# Patient Record
Sex: Female | Born: 1975 | Race: Black or African American | Hispanic: No | Marital: Married | Smoking: Never smoker
Health system: Southern US, Community
[De-identification: ages and names within clinical notes are randomized; demographics above are authoritative.]

## PROBLEM LIST (undated history)

## (undated) ENCOUNTER — Inpatient Hospital Stay (HOSPITAL_COMMUNITY): Payer: Self-pay

## (undated) DIAGNOSIS — T783XXA Angioneurotic edema, initial encounter: Secondary | ICD-10-CM

## (undated) DIAGNOSIS — D649 Anemia, unspecified: Secondary | ICD-10-CM

## (undated) HISTORY — DX: Anemia, unspecified: D64.9

## (undated) HISTORY — PX: INNER EAR SURGERY: SHX679

## (undated) HISTORY — DX: Angioneurotic edema, initial encounter: T78.3XXA

---

## 2011-08-26 DIAGNOSIS — D649 Anemia, unspecified: Secondary | ICD-10-CM

## 2011-08-26 HISTORY — DX: Anemia, unspecified: D64.9

## 2011-09-05 ENCOUNTER — Encounter: Payer: Self-pay | Admitting: *Deleted

## 2011-09-05 ENCOUNTER — Ambulatory Visit (INDEPENDENT_AMBULATORY_CARE_PROVIDER_SITE_OTHER): Payer: Commercial Managed Care - PPO | Admitting: Family Medicine

## 2011-09-05 ENCOUNTER — Telehealth: Payer: Self-pay

## 2011-09-05 ENCOUNTER — Ambulatory Visit: Payer: Self-pay | Admitting: Women's Health

## 2011-09-05 VITALS — BP 120/79 | HR 79 | Temp 98.5°F | Resp 18 | Ht 64.5 in | Wt 156.0 lb

## 2011-09-05 DIAGNOSIS — K5289 Other specified noninfective gastroenteritis and colitis: Secondary | ICD-10-CM

## 2011-09-05 DIAGNOSIS — K529 Noninfective gastroenteritis and colitis, unspecified: Secondary | ICD-10-CM

## 2011-09-05 DIAGNOSIS — D509 Iron deficiency anemia, unspecified: Secondary | ICD-10-CM

## 2011-09-05 DIAGNOSIS — R1110 Vomiting, unspecified: Secondary | ICD-10-CM

## 2011-09-05 DIAGNOSIS — K0889 Other specified disorders of teeth and supporting structures: Secondary | ICD-10-CM

## 2011-09-05 DIAGNOSIS — R5383 Other fatigue: Secondary | ICD-10-CM

## 2011-09-05 DIAGNOSIS — R110 Nausea: Secondary | ICD-10-CM

## 2011-09-05 DIAGNOSIS — R5381 Other malaise: Secondary | ICD-10-CM

## 2011-09-05 LAB — POCT CBC
Lymph, poc: 1.2 (ref 0.6–3.4)
MCH, POC: 23.4 pg — AB (ref 27–31.2)
MCV: 75 fL — AB (ref 80–97)
MID (cbc): 0.3 (ref 0–0.9)
POC LYMPH PERCENT: 13.7 %L (ref 10–50)
Platelet Count, POC: 265 10*3/uL (ref 142–424)
RBC: 4.62 M/uL (ref 4.04–5.48)
WBC: 8.9 10*3/uL (ref 4.6–10.2)

## 2011-09-05 LAB — POCT URINALYSIS DIPSTICK
Blood, UA: NEGATIVE
Protein, UA: 30
Spec Grav, UA: >=1.03
Urobilinogen, UA: 1

## 2011-09-05 LAB — POCT UA - MICROSCOPIC ONLY
Casts, Ur, LPF, POC: NEGATIVE
Crystals, Ur, HPF, POC: NEGATIVE

## 2011-09-05 MED ORDER — PROMETHAZINE HCL 12.5 MG PO TABS: 12.5000 mg | ORAL_TABLET | Freq: Three times a day (TID) | ORAL | Status: DC | PRN

## 2011-09-05 MED ORDER — ONDANSETRON 4 MG PO TBDP
4.0000 mg | ORAL_TABLET | Freq: Once | ORAL | Status: AC
  Administered 2011-09-05: 4 mg via ORAL

## 2011-09-05 MED ORDER — ONDANSETRON 4 MG PO TBDP: 4.0000 mg | ORAL_TABLET | Freq: Four times a day (QID) | ORAL | Status: AC | PRN

## 2011-09-05 NOTE — Patient Instructions (Signed)
Viral Gastroenteritis Viral gastroenteritis is also known as stomach flu. This condition affects the stomach and intestinal tract. It can cause sudden diarrhea and vomiting. The illness typically lasts 3 to 8 days. Most people develop an immune response that eventually gets rid of the virus. While this natural response develops, the virus can make you quite ill. CAUSES  Many different viruses can cause gastroenteritis, such as rotavirus or noroviruses. You can catch one of these viruses by consuming contaminated food or water. You may also catch a virus by sharing utensils or other personal items with an infected person or by touching a contaminated surface. SYMPTOMS  The most common symptoms are diarrhea and vomiting. These problems can cause a severe loss of body fluids (dehydration) and a body salt (electrolyte) imbalance. Other symptoms may include:  Fever.   Headache.   Fatigue.   Abdominal pain.  DIAGNOSIS  Your caregiver can usually diagnose viral gastroenteritis based on your symptoms and a physical exam. A stool sample may also be taken to test for the presence of viruses or other infections. TREATMENT  This illness typically goes away on its own. Treatments are aimed at rehydration. The most serious cases of viral gastroenteritis involve vomiting so severely that you are not able to keep fluids down. In these cases, fluids must be given through an intravenous line (IV). HOME CARE INSTRUCTIONS   Drink enough fluids to keep your urine clear or pale yellow. Drink small amounts of fluids frequently and increase the amounts as tolerated.   Ask your caregiver for specific rehydration instructions.   Avoid:   Foods high in sugar.   Alcohol.   Carbonated drinks.   Tobacco.   Juice.   Caffeine drinks.   Extremely hot or cold fluids.   Fatty, greasy foods.   Too much intake of anything at one time.   Dairy products until 24 to 48 hours after diarrhea stops.   You may  consume probiotics. Probiotics are active cultures of beneficial bacteria. They may lessen the amount and number of diarrheal stools in adults. Probiotics can be found in yogurt with active cultures and in supplements.   Wash your hands well to avoid spreading the virus.   Only take over-the-counter or prescription medicines for pain, discomfort, or fever as directed by your caregiver. Do not give aspirin to children. Antidiarrheal medicines are not recommended.   Ask your caregiver if you should continue to take your regular prescribed and over-the-counter medicines.   Keep all follow-up appointments as directed by your caregiver.  SEEK IMMEDIATE MEDICAL CARE IF:   You are unable to keep fluids down.   You do not urinate at least once every 6 to 8 hours.   You develop shortness of breath.   You notice blood in your stool or vomit. This may look like coffee grounds.   You have abdominal pain that increases or is concentrated in one small area (localized).   You have persistent vomiting or diarrhea.   You have a fever.   The patient is a child younger than 3 months, and he or she has a fever.   The patient is a child older than 3 months, and he or she has a fever and persistent symptoms.   The patient is a child older than 3 months, and he or she has a fever and symptoms suddenly get worse.   The patient is a baby, and he or she has no tears when crying.  MAKE SURE YOU:     Understand these instructions.   Will watch your condition.   Will get help right away if you are not doing well or get worse.  Document Released: 06/13/2005 Document Revised: 06/02/2011 Document Reviewed: 03/30/2011 [employer] Patient Information 2012 [employer]. 

## 2011-09-05 NOTE — Telephone Encounter (Signed)
Pt calling back regarding meds being to expensive would like an alternative  [phone]

## 2011-09-05 NOTE — Telephone Encounter (Signed)
Pt notified that rx is at pharmacy.

## 2011-09-05 NOTE — Telephone Encounter (Signed)
.  [facility] PT STATES THE MEDICINE SHE WAS GIVEN TODAY BY DR HOPPER IS REALLY EXPENSIVE AND WOULD LIKE TO KNOW SOMETHING ELSE SHE CAN GET PLEASE CALL [phone]

## 2011-09-05 NOTE — Progress Notes (Signed)
Subjective: Patient is here for the first time. She had some right upper dental pain 2 days ago. She took some ibuprofen for that yesterday on an empty stomach, and went over to [place]. They were sitting eating pizza she got sick in his stomach started vomiting. She vomited yesterday evening and today. Now it is just bilious fluid that she vomits. She tried some ginger ale this morning but came back up. She has continued to urinate. She has some nonspecific mid abdominal pain. She has felt chilled. Has not been around anyone else with a gastroenteritis type problem that she knows of. The tooth still is a little bit tender, but not as bad.  Objective: Her TMs are normal. Throat clear. Teeth have some debris around the gums but no real erythema or dental abscess is noted. The Upper teeth are a little bit tender. Neck was supple without significant nodes. Chest clear. Heart regular without murmurs. Abdomen had normal bowel sounds, was soft and only mild nonspecific mid epigastric and periumbilical tenderness.   Her last period was 2 weeks ago. She is scheduled to see her GYN today to get on birth control as she is planning to get married in 2 months. She is not sexually involved.  Assessment: Vomiting Dental pain Gastroenteritis  Plan: With her chills I'm going in checking a CBC on her. Give her a Zofran 4 mg, then decide what treatment we do.  Results for orders placed in visit on 09/05/11  POCT CBC      Component Value Range   WBC 8.9  4.6 - 10.2 (K/uL)   Lymph, poc 1.2  0.6 - 3.4    POC LYMPH PERCENT 13.7  10 - 50 (%L)   MID (cbc) 0.3  0 - 0.9    POC MID % 3.1  0 - 12 (%M)   POC Granulocyte 7.4 (*) 2 - 6.9    Granulocyte percent 83.2 (*) 37 - 80 (%G)   RBC 4.62  4.04 - 5.48 (M/uL)   Hemoglobin 10.8 (*) 12.2 - 16.2 (g/dL)   HCT, POC 56.2 (*) 13.0 - 47.9 (%)   MCV 75.0 (*) 80 - 97 (fL)   MCH, POC 23.4 (*) 27 - 31.2 (pg)   MCHC 31.2 (*) 31.8 - 35.4 (g/dL)   RDW, POC 86.5     Platelet  Count, POC 265  142 - 424 (K/uL)   MPV 11.0  0 - 99.8 (fL)   Results for orders placed in visit on 09/05/11  POCT CBC      Component Value Range   WBC 8.9  4.6 - 10.2 (K/uL)   Lymph, poc 1.2  0.6 - 3.4    POC LYMPH PERCENT 13.7  10 - 50 (%L)   MID (cbc) 0.3  0 - 0.9    POC MID % 3.1  0 - 12 (%M)   POC Granulocyte 7.4 (*) 2 - 6.9    Granulocyte percent 83.2 (*) 37 - 80 (%G)   RBC 4.62  4.04 - 5.48 (M/uL)   Hemoglobin 10.8 (*) 12.2 - 16.2 (g/dL)   HCT, POC 78.4 (*) 69.6 - 47.9 (%)   MCV 75.0 (*) 80 - 97 (fL)   MCH, POC 23.4 (*) 27 - 31.2 (pg)   MCHC 31.2 (*) 31.8 - 35.4 (g/dL)   RDW, POC 29.5     Platelet Count, POC 265  142 - 424 (K/uL)   MPV 11.0  0 - 99.8 (fL)  POCT UA - MICROSCOPIC ONLY  Component Value Range   WBC, Ur, HPF, POC 4-6     RBC, urine, microscopic NEG     Bacteria, U Microscopic 1+     Mucus, UA POS     Epithelial cells, urine per micros 3-6     Crystals, Ur, HPF, POC NEG     Casts, Ur, LPF, POC NEG     Yeast, UA NEG    POCT URINALYSIS DIPSTICK      Component Value Range   Color, UA YELLOW     Clarity, UA CLEAR     Glucose, UA NEG     Bilirubin, UA SMALL     Ketones, UA TRACE     Spec Grav, UA >=1.030     Blood, UA NEG     pH, UA 5.5     Protein, UA 30     Urobilinogen, UA 1.0     Nitrite, UA NEG     Leukocytes, UA Negative      Urinalysis is concentrated, otherwise labs look good. She probably has some moderate dehydration.  Given excuse for 2 days. Encourage fluids. Take over-the-counter iron twice daily for 2 months, then once daily or if problems. Additional diagnoses of fatigue and deficiency anemia.

## 2011-09-05 NOTE — Telephone Encounter (Signed)
Done. Changed to Phenergan. Please tell patient Rx is at the pharmacy.  Helton Oleson

## 2011-09-06 LAB — COMPREHENSIVE METABOLIC PANEL
Alkaline Phosphatase: 69 U/L (ref 39–117)
BUN: 13 mg/dL (ref 6–23)
Creat: 0.7 mg/dL (ref 0.50–1.10)
Glucose, Bld: 124 mg/dL — ABNORMAL HIGH (ref 70–99)
Sodium: 139 mEq/L (ref 135–145)
Total Bilirubin: 0.5 mg/dL (ref 0.3–1.2)

## 2011-09-06 LAB — IRON: Iron: 25 ug/dL — ABNORMAL LOW (ref 42–145)

## 2011-09-06 LAB — IBC PANEL
%SAT: 5 % — ABNORMAL LOW (ref 20–55)
TIBC: 494 ug/dL — ABNORMAL HIGH (ref 250–470)

## 2011-09-12 ENCOUNTER — Encounter: Payer: Self-pay | Admitting: Family Medicine

## 2011-09-14 ENCOUNTER — Ambulatory Visit: Payer: Self-pay | Admitting: Women's Health

## 2011-09-26 ENCOUNTER — Encounter: Payer: Self-pay | Admitting: Women's Health

## 2011-09-26 ENCOUNTER — Ambulatory Visit (INDEPENDENT_AMBULATORY_CARE_PROVIDER_SITE_OTHER): Payer: Commercial Managed Care - PPO | Admitting: Women's Health

## 2011-09-26 ENCOUNTER — Other Ambulatory Visit (HOSPITAL_COMMUNITY)
Admission: RE | Admit: 2011-09-26 | Discharge: 2011-09-26 | Disposition: A | Discharge status: Home / Self Care | Payer: Commercial Managed Care - PPO | Source: Ambulatory Visit | Attending: Obstetrics and Gynecology | Admitting: Obstetrics and Gynecology

## 2011-09-26 VITALS — BP 110/72 | Ht 64.25 in | Wt 159.0 lb

## 2011-09-26 DIAGNOSIS — R7309 Other abnormal glucose: Secondary | ICD-10-CM

## 2011-09-26 DIAGNOSIS — IMO0001 Reserved for inherently not codable concepts without codable children: Secondary | ICD-10-CM

## 2011-09-26 DIAGNOSIS — Z01419 Encounter for gynecological examination (general) (routine) without abnormal findings: Secondary | ICD-10-CM | POA: Insufficient documentation

## 2011-09-26 DIAGNOSIS — R739 Hyperglycemia, unspecified: Secondary | ICD-10-CM

## 2011-09-26 DIAGNOSIS — Z309 Encounter for contraceptive management, unspecified: Secondary | ICD-10-CM

## 2011-09-26 MED ORDER — NORGESTIMATE-ETH ESTRADIOL 0.25-35 MG-MCG PO TABS: 1.0000 | ORAL_TABLET | Freq: Every day | ORAL | Status: DC

## 2011-09-26 NOTE — Patient Instructions (Signed)
Health Maintenance, Females A healthy lifestyle and preventative care can promote health and wellness.  Maintain regular health, dental, and eye exams.   Eat a healthy diet. Foods like vegetables, fruits, whole grains, low-fat dairy products, and lean protein foods contain the nutrients you need without too many calories. Decrease your intake of foods high in solid fats, added sugars, and salt. Get information about a proper diet from your caregiver, if necessary.   Regular physical exercise is one of the most important things you can do for your health. Most adults should get at least 150 minutes of moderate-intensity exercise (any activity that increases your heart rate and causes you to sweat) each week. In addition, most adults need muscle-strengthening exercises on 2 or more days a week.    Maintain a healthy weight. The body mass index (BMI) is a screening tool to identify possible weight problems. It provides an estimate of body fat based on height and weight. Your caregiver can help determine your BMI, and can help you achieve or maintain a healthy weight. For adults 20 years and older:   A BMI below 18.5 is considered underweight.   A BMI of 18.5 to 24.9 is normal.   A BMI of 25 to 29.9 is considered overweight.   A BMI of 30 and above is considered obese.   Maintain normal blood lipids and cholesterol by exercising and minimizing your intake of saturated fat. Eat a balanced diet with plenty of fruits and vegetables. Blood tests for lipids and cholesterol should begin at age 20 and be repeated every 5 years. If your lipid or cholesterol levels are high, you are over 50, or you are a high risk for heart disease, you may need your cholesterol levels checked more frequently.Ongoing high lipid and cholesterol levels should be treated with medicines if diet and exercise are not effective.   If you smoke, find out from your caregiver how to quit. If you do not use tobacco, do not start.    If you are pregnant, do not drink alcohol. If you are breastfeeding, be very cautious about drinking alcohol. If you are not pregnant and choose to drink alcohol, do not exceed 1 drink per day. One drink is considered to be 12 ounces (355 mL) of beer, 5 ounces (148 mL) of wine, or 1.5 ounces (44 mL) of liquor.   Avoid use of street drugs. Do not share needles with anyone. Ask for help if you need support or instructions about stopping the use of drugs.   High blood pressure causes heart disease and increases the risk of stroke. Blood pressure should be checked at least every 1 to 2 years. Ongoing high blood pressure should be treated with medicines, if weight loss and exercise are not effective.   If you are 55 to 34 years old, ask your caregiver if you should take aspirin to prevent strokes.   Diabetes screening involves taking a blood sample to check your fasting blood sugar level. This should be done once every 3 years, after age 45, if you are within normal weight and without risk factors for diabetes. Testing should be considered at a younger age or be carried out more frequently if you are overweight and have at least 1 risk factor for diabetes.   Breast cancer screening is essential preventative care for women. You should practice "breast self-awareness." This means understanding the normal appearance and feel of your breasts and may include breast self-examination. Any changes detected, no matter how   small, should be reported to a caregiver. Women in their 20s and 30s should have a clinical breast exam (CBE) by a caregiver as part of a regular health exam every 1 to 3 years. After age 40, women should have a CBE every year. Starting at age 40, women should consider having a mammogram (breast X-ray) every year. Women who have a family history of breast cancer should talk to their caregiver about genetic screening. Women at a high risk of breast cancer should talk to their caregiver about having  an MRI and a mammogram every year.   The Pap test is a screening test for cervical cancer. Women should have a Pap test starting at age 21. Between ages 21 and 29, Pap tests should be repeated every 2 years. Beginning at age 30, you should have a Pap test every 3 years as long as the past 3 Pap tests have been normal. If you had a hysterectomy for a problem that was not cancer or a condition that could lead to cancer, then you no longer need Pap tests. If you are between ages 65 and 70, and you have had normal Pap tests going back 10 years, you no longer need Pap tests. If you have had past treatment for cervical cancer or a condition that could lead to cancer, you need Pap tests and screening for cancer for at least 20 years after your treatment. If Pap tests have been discontinued, risk factors (such as a new sexual partner) need to be reassessed to determine if screening should be resumed. Some women have medical problems that increase the chance of getting cervical cancer. In these cases, your caregiver may recommend more frequent screening and Pap tests.   The human papillomavirus (HPV) test is an additional test that may be used for cervical cancer screening. The HPV test looks for the virus that can cause the cell changes on the cervix. The cells collected during the Pap test can be tested for HPV. The HPV test could be used to screen women aged 30 years and older, and should be used in women of any age who have unclear Pap test results. After the age of 30, women should have HPV testing at the same frequency as a Pap test.   Colorectal cancer can be detected and often prevented. Most routine colorectal cancer screening begins at the age of 50 and continues through age 75. However, your caregiver may recommend screening at an earlier age if you have risk factors for colon cancer. On a yearly basis, your caregiver may provide home test kits to check for hidden blood in the stool. Use of a small camera at  the end of a tube, to directly examine the colon (sigmoidoscopy or colonoscopy), can detect the earliest forms of colorectal cancer. Talk to your caregiver about this at age 50, when routine screening begins. Direct examination of the colon should be repeated every 5 to 10 years through age 75, unless early forms of pre-cancerous polyps or small growths are found.   Hepatitis C blood testing is recommended for all people born from 1945 through 1965 and any individual with known risks for hepatitis C.   Practice safe sex. Use condoms and avoid high-risk sexual practices to reduce the spread of sexually transmitted infections (STIs). Sexually active women aged 25 and younger should be checked for Chlamydia, which is a common sexually transmitted infection. Older women with new or multiple partners should also be tested for Chlamydia. Testing for other   STIs is recommended if you are sexually active and at increased risk.   Osteoporosis is a disease in which the bones lose minerals and strength with aging. This can result in serious bone fractures. The risk of osteoporosis can be identified using a bone density scan. Women ages 63 and over and women at risk for fractures or osteoporosis should discuss screening with their caregivers. Ask your caregiver whether you should be taking a calcium supplement or vitamin D to reduce the rate of osteoporosis.   Menopause can be associated with physical symptoms and risks. Hormone replacement therapy is available to decrease symptoms and risks. You should talk to your caregiver about whether hormone replacement therapy is right for you.   Use sunscreen with a sun protection factor (SPF) of 30 or greater. Apply sunscreen liberally and repeatedly throughout the day. You should seek shade when your shadow is shorter than you. Protect yourself by wearing long sleeves, pants, a wide-brimmed hat, and sunglasses year round, whenever you are outdoors.   Notify your caregiver  of new moles or changes in moles, especially if there is a change in shape or color. Also notify your caregiver if a mole is larger than the size of a pencil eraser.   Stay current with your immunizations.  Document Released: 12/27/2010 Document Revised: 06/02/2011 Document Reviewed: 12/27/2010 [facility] Patient Information 2012 [place], [place].Diabetes and Exercise Regular exercise is important and can help:   Control blood glucose (sugar).   Decrease blood pressure.    Control blood lipids (cholesterol, triglycerides).   Improve overall health.  BENEFITS FROM EXERCISE  Improved fitness.   Improved flexibility.   Improved endurance.   Increased bone density.   Weight control.   Increased muscle strength.   Decreased body fat.   Improvement of the body's use of insulin, a hormone.   Increased insulin sensitivity.   Reduction of insulin needs.   Reduced stress and tension.   Helps you feel better.  People with diabetes who add exercise to their lifestyle gain additional benefits, including:  Weight loss.   Reduced appetite.   Improvement of the body's use of blood glucose.   Decreased risk factors for heart disease:   Lowering of cholesterol and triglycerides.   Raising the level of good cholesterol (high-density lipoproteins, HDL).   Lowering blood sugar.   Decreased blood pressure.  TYPE 1 DIABETES AND EXERCISE  Exercise will usually lower your blood glucose.   If blood glucose is greater than 240 mg/dl, check urine ketones. If ketones are present, do not exercise.   Location of the insulin injection sites may need to be adjusted with exercise. Avoid injecting insulin into areas of the body that will be exercised. For example, avoid injecting insulin into:   The arms when playing tennis.   The legs when jogging. For more information, discuss this with your caregiver.   Keep a record of:   Food intake.   Type and amount of exercise.    Expected peak times of insulin action.   Blood glucose levels.  Do this before, during, and after exercise. Review your records with your caregiver. This will help you to develop guidelines for adjusting food intake and insulin amounts.  TYPE 2 DIABETES AND EXERCISE  Regular physical activity can help control blood glucose.   Exercise is important because it may:   Increase the body's sensitivity to insulin.   Improve blood glucose control.   Exercise reduces the risk of heart disease. It decreases serum cholesterol  and triglycerides. It also lowers blood pressure.   Those who take insulin or oral hypoglycemic agents should watch for signs of hypoglycemia. These signs include dizziness, shaking, sweating, chills, and confusion.   Body water is lost during exercise. It must be replaced. This will help to avoid loss of body fluids (dehydration) or heat stroke.  Be sure to talk to your caregiver before starting an exercise program to make sure it is safe for you. Remember, any activity is better than none.  Document Released: 09/03/2003 Document Revised: 06/02/2011 Document Reviewed: 12/18/2008 [facility] Patient Information 2012 [place], [place].

## 2011-09-26 NOTE — Progress Notes (Signed)
Karen Pollard 06-02-1976 [id number]    History:    The patient presents for annual exam.  Presents for her first GYN exam/virgin and requesting  birth control pills. States saw a primary care doctor at an urgent care last week and had an elevated blood sugar of 124 and was found to be slightly anemic. Has followup in one week.   Past medical history, past surgical history, family history and social history were all reviewed and documented in the EPIC chart. 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   ROS:  A  ROS was performed and pertinent positives and negatives are included in the history.  Exam:  Filed Vitals:   09/26/11 1427  BP: 110/72    General appearance:  Normal Head/Neck:  Normal, without cervical or supraclavicular adenopathy. Thyroid:  Symmetrical, normal in size, without palpable masses or nodularity. Respiratory  Effort:  Normal  Auscultation:  Clear without wheezing or rhonchi Cardiovascular  Auscultation:  Regular rate, without rubs, murmurs or gallops  Edema/varicosities:  Not grossly evident Abdominal  Soft,nontender, without masses, guarding or rebound.  Liver/spleen:  No organomegaly noted  Hernia:  None appreciated  Skin  Inspection:  Grossly normal  Palpation:  Grossly normal Neurologic/psychiatric  Orientation:  Normal with appropriate conversation.  Mood/affect:  Normal  Genitourinary    Breasts: Examined lying and sitting.     Right: Without masses, retractions, discharge or axillary adenopathy.     Left: Without masses, retractions, discharge or axillary adenopathy.   Inguinal/mons:  Normal without inguinal adenopathy  External genitalia:  Normal  BUS/Urethra/Skene's glands:  Normal  Bladder:  Normal  Vagina:  Normal/virginal  Cervix:  Normal  Uterus:   normal in size, shape and contour.  Midline and mobile  Adnexa/parametria:     Rt: Without masses or tenderness.   Lt: Without masses or tenderness.  Anus and perineum: Normal  Digital  rectal exam: Normal sphincter tone without palpated masses or tenderness  Assessment/Plan:  36 y.o. SBF virgin for annual exam.    Contraception management  Elevated blood sugar/anemia-primary care  Plan: Rubella titer, hemoglobin A1c and Pap. Reviewed will mail results to patient to take to followup appointment. Reviewed importance of followup for the elevated blood sugar/questionable diabetes. Discussed decreasing simple sugars/sodas and carbs, increasing exercise. Instructed to have fiance had a STD screen prior to marriage. Contraception options reviewed, Ortho-Cyclen prescription, proper use, slight risk for blood clots and strokes reviewed. Nonsmoker. Instructed to start first day of next cycle, reviewed if cycle due week of honeymoon to start new pack skipping placebo week. Instructions both verbally reviewed and written for patient. SBE's, exercise, calcium rich diet, MVI daily encouraged.     Harrington Challenger [facility], 5:42 PM 09/26/2011

## 2011-09-27 LAB — RUBELLA SCREEN: Rubella: 5.6 IU/mL — ABNORMAL HIGH

## 2011-11-03 ENCOUNTER — Telehealth: Payer: Self-pay | Admitting: *Deleted

## 2011-11-03 DIAGNOSIS — IMO0001 Reserved for inherently not codable concepts without codable children: Secondary | ICD-10-CM

## 2011-11-03 MED ORDER — NORGESTIMATE-ETH ESTRADIOL 0.25-35 MG-MCG PO TABS: 1.0000 | ORAL_TABLET | Freq: Every day | ORAL | Status: DC

## 2011-11-03 NOTE — Telephone Encounter (Signed)
Pt called asking rx be sent to [facility] rather than [facility], rx sent.

## 2011-12-13 ENCOUNTER — Telehealth: Payer: Self-pay | Admitting: *Deleted

## 2011-12-13 NOTE — Telephone Encounter (Signed)
Pt called concerned with black thin line from top of her stomach to belly button, pt has no taken UPT yet will do this in am. LMP: last week of may.  irregular cycle past before. Pt will call with UPT test results.

## 2011-12-14 ENCOUNTER — Telehealth: Payer: Self-pay | Admitting: *Deleted

## 2011-12-14 NOTE — Telephone Encounter (Signed)
Follow up from yesterday telephone encounter, pt took upt and it was negative, she will wait until period will start and if no period will recheck upt again and follow up with results.

## 2012-01-13 ENCOUNTER — Encounter: Payer: Self-pay | Admitting: Women's Health

## 2012-01-13 ENCOUNTER — Ambulatory Visit (INDEPENDENT_AMBULATORY_CARE_PROVIDER_SITE_OTHER): Payer: Commercial Managed Care - PPO | Admitting: Women's Health

## 2012-01-13 ENCOUNTER — Telehealth: Payer: Self-pay | Admitting: Women's Health

## 2012-01-13 VITALS — BP 110/70 | Ht 64.25 in | Wt 166.0 lb

## 2012-01-13 DIAGNOSIS — Z833 Family history of diabetes mellitus: Secondary | ICD-10-CM

## 2012-01-13 DIAGNOSIS — Z01419 Encounter for gynecological examination (general) (routine) without abnormal findings: Secondary | ICD-10-CM

## 2012-01-13 DIAGNOSIS — N912 Amenorrhea, unspecified: Secondary | ICD-10-CM

## 2012-01-13 DIAGNOSIS — E079 Disorder of thyroid, unspecified: Secondary | ICD-10-CM

## 2012-01-13 DIAGNOSIS — R5383 Other fatigue: Secondary | ICD-10-CM

## 2012-01-13 NOTE — Progress Notes (Signed)
Patient ID: Jameka Ivie, female   DOB: 01/20/1976, 36 y.o.   MRN: [id number] Presents with the complaint of increased fatigue, weight gain, and spotting. Was seen in the office in April for annual exam and  contraception management. Started Ortho-Cyclen as instructed for one month and then did not continue. Last cycle 3-1/2 weeks ago. Weight today 166, weight in April 159. States feels like she is getting enough sleep at night. Had an elevated blood sugar of 124 with a normal hemoglobin A1c was instructed to followup with primary care but did not.  Exam: Mentally slow, abdomen soft nontender, external genitalia within normal limits, speculum exam moderate menses type blood noted. Bimanual uterus is small nontender. U PT negative.  Fatigue Contraception management  Plan: CBC, TSH, glucose. Instructed to start back on Ortho-Cyclen take daily can start today. Encouraged condoms first month. Instructed to call if cycles don't regulate. Encouraged a multivitamin daily, sleep hygiene reviewed.

## 2012-01-16 ENCOUNTER — Other Ambulatory Visit: Payer: Commercial Managed Care - PPO

## 2012-01-16 LAB — CBC WITH DIFFERENTIAL/PLATELET
Basophils Relative: 1 % (ref 0–1)
Eosinophils Absolute: 0.3 10*3/uL (ref 0.0–0.7)
HCT: 35.8 % — ABNORMAL LOW (ref 36.0–46.0)
Hemoglobin: 11.9 g/dL — ABNORMAL LOW (ref 12.0–15.0)
Lymphs Abs: 1.3 10*3/uL (ref 0.7–4.0)
MCH: 27 pg (ref 26.0–34.0)
MCHC: 33.2 g/dL (ref 30.0–36.0)
MCV: 81.2 fL (ref 78.0–100.0)
Monocytes Absolute: 0.3 10*3/uL (ref 0.1–1.0)
Monocytes Relative: 8 % (ref 3–12)
Neutrophils Relative %: 46 % (ref 43–77)
RBC: 4.41 MIL/uL (ref 3.87–5.11)

## 2012-01-16 LAB — GLUCOSE, RANDOM: Glucose, Bld: 83 mg/dL (ref 70–99)

## 2012-01-18 NOTE — Telephone Encounter (Signed)
Instructed to return to the office for labs.

## 2012-03-09 ENCOUNTER — Emergency Department (INDEPENDENT_AMBULATORY_CARE_PROVIDER_SITE_OTHER)
Admission: EM | Admit: 2012-03-09 | Discharge: 2012-03-09 | Disposition: A | Discharge status: Home / Self Care | Payer: BC Managed Care – PPO | Source: Home / Self Care

## 2012-03-09 ENCOUNTER — Encounter (HOSPITAL_COMMUNITY): Payer: Self-pay | Admitting: Emergency Medicine

## 2012-03-09 DIAGNOSIS — S39012A Strain of muscle, fascia and tendon of lower back, initial encounter: Secondary | ICD-10-CM

## 2012-03-09 DIAGNOSIS — S338XXA Sprain of other parts of lumbar spine and pelvis, initial encounter: Secondary | ICD-10-CM

## 2012-03-09 MED ORDER — NAPROXEN 500 MG PO TABS: 500.0000 mg | ORAL_TABLET | Freq: Two times a day (BID) | ORAL | Status: DC

## 2012-03-09 MED ORDER — TRAMADOL HCL 50 MG PO TABS: 50.0000 mg | ORAL_TABLET | Freq: Four times a day (QID) | ORAL | Status: AC | PRN

## 2012-03-09 NOTE — ED Provider Notes (Signed)
History     CSN: [id number]  Arrival date & time 03/09/12  0865   None     Chief Complaint  Patient presents with  . Back Pain    (Consider location/radiation/quality/duration/timing/severity/associated sxs/prior treatment) Patient is a 36 y.o. female presenting with back pain.  Back Pain  This is a new problem. The current episode started 2 days ago. The problem occurs daily. The problem has not changed since onset.The pain is associated with twisting and lifting heavy objects. The quality of the pain is described as aching. The pain does not radiate. The pain is at a severity of 5/10. The pain is moderate. The symptoms are aggravated by bending, twisting and certain positions. Pertinent negatives include no chest pain, no fever, no numbness, no weight loss, no headaches, no abdominal pain, no abdominal swelling and no weakness. She has tried nothing for the symptoms. Risk factors include poor posture and lack of exercise.    Past Medical History  Diagnosis Date  . Diabetes mellitus 3-13    BORDERLINE  . Anemia 3-13    TOLD TO TAKE IRON PILLS    Past Surgical History  Procedure Date  . Inner ear surgery AS A CHILD    DOES NOT REMEMBER WHICH EAR    Family History  Problem Relation Age of Onset  . Hypertension Father   . Diabetes Father     History  Substance Use Topics  . Smoking status: Never Smoker   . Smokeless tobacco: Never Used  . Alcohol Use: No    OB History    Grav Para Term Preterm Abortions TAB SAB Ect Mult Living   0               Review of Systems  Constitutional: Negative for fever, weight loss and fatigue.  Respiratory: Negative.   Cardiovascular: Negative for chest pain.  Gastrointestinal: Negative.  Negative for abdominal pain.  Genitourinary: Negative.   Musculoskeletal: Positive for myalgias and back pain. Negative for joint swelling, arthralgias and gait problem.  Skin: Negative.   Neurological: Negative.  Negative for tremors,  weakness, numbness and headaches.    Allergies  Review of patient's allergies indicates no known allergies.  Home Medications   Current Outpatient Rx  Name Route Sig Dispense Refill  . NAPROXEN 500 MG PO TABS Oral Take 1 tablet (500 mg total) by mouth 2 (two) times daily. With food 30 tablet 0  . NORGESTIMATE-ETH ESTRADIOL 0.25-35 MG-MCG PO TABS Oral Take 1 tablet by mouth daily. 3 Package 3  . TRAMADOL HCL 50 MG PO TABS Oral Take 1 tablet (50 mg total) by mouth every 6 (six) hours as needed for pain. 15 tablet 0    BP 129/80  Pulse 76  Temp 98.9 F (37.2 C) (Oral)  Resp 16  SpO2 100%  LMP 02/19/2012  Physical Exam  Constitutional: She is oriented to person, place, and time. She appears well-developed and well-nourished. No distress.  Neck: Normal range of motion. Neck supple.  Pulmonary/Chest: Effort normal. She has no wheezes.  Musculoskeletal:       Tenderness over sacrum and L parasacral muscles; pain reproduced with forward flexion to 90 deg.   Neurological: She is alert and oriented to person, place, and time. She has normal reflexes. No cranial nerve deficit.    ED Course  Procedures (including critical care time)  Labs Reviewed - No data to display No results found.   1. Strain, sacral       MDM  ULtram 50mg  q 6h prn Naprosyn bid with food BID Heat, stretches and back exercises. Limit bending, lifting, pulling        Hayden Rasmussen, NP 03/09/12 1013  Hayden Rasmussen, NP 03/09/12 1015

## 2012-03-09 NOTE — ED Notes (Signed)
Pt c/o lower back pain x3 days... Says she bend over to get something from the fridge when she felt something "funny"... Since then, she's been having difficulty to walk and having pain that shoots to her left leg and left arm... She denies any numbness or weakness.

## 2012-03-10 NOTE — ED Provider Notes (Signed)
Medical screening examination/treatment/procedure(s) were performed by resident physician or non-physician practitioner and as supervising physician I was immediately available for consultation/collaboration.   Barkley Bruns MD.    Linna Hoff, MD 03/10/12 1048

## 2012-11-07 ENCOUNTER — Encounter: Payer: Self-pay | Admitting: Women's Health

## 2012-11-14 ENCOUNTER — Ambulatory Visit (INDEPENDENT_AMBULATORY_CARE_PROVIDER_SITE_OTHER): Payer: Commercial Managed Care - PPO | Admitting: Women's Health

## 2012-11-14 ENCOUNTER — Encounter: Payer: Self-pay | Admitting: Women's Health

## 2012-11-14 VITALS — BP 124/80 | Ht 64.0 in | Wt 182.0 lb

## 2012-11-14 DIAGNOSIS — Z833 Family history of diabetes mellitus: Secondary | ICD-10-CM

## 2012-11-14 DIAGNOSIS — Z01419 Encounter for gynecological examination (general) (routine) without abnormal findings: Secondary | ICD-10-CM

## 2012-11-14 DIAGNOSIS — Z789 Other specified health status: Secondary | ICD-10-CM | POA: Insufficient documentation

## 2012-11-14 DIAGNOSIS — N926 Irregular menstruation, unspecified: Secondary | ICD-10-CM

## 2012-11-14 DIAGNOSIS — N946 Dysmenorrhea, unspecified: Secondary | ICD-10-CM

## 2012-11-14 MED ORDER — IBUPROFEN 600 MG PO TABS: 600.0000 mg | ORAL_TABLET | Freq: Three times a day (TID) | ORAL | Status: DC | PRN

## 2012-11-14 NOTE — Progress Notes (Signed)
Karen Pollard January 04, 1976 [id number]    History:    The patient presents for annual exam.  Report cycles as irregular, every 3-5 weeks for 5 days. Stopped birth control pills 4-5 months ago desiring conception. Husband has 4 other children youngest 5. First Pap 2013, normal. Blood sugar 124 at family practice office, recheck 21 with a hemoglobin A1c of 5.3. Rubella equivocal did not get vaccine.   Past medical history, past surgical history, family history and social history were all reviewed and documented in the EPIC chart. 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   ROS:  A  ROS was performed and pertinent positives and negatives are included in the history.  Exam:  Filed Vitals:   11/14/12 1548  BP: 124/80    General appearance:  Normal Head/Neck:  Normal, without cervical or supraclavicular adenopathy. Thyroid:  Symmetrical, normal in size, without palpable masses or nodularity. Respiratory  Effort:  Normal  Auscultation:  Clear without wheezing or rhonchi Cardiovascular  Auscultation:  Regular rate, without rubs, murmurs or gallops  Edema/varicosities:  Not grossly evident Abdominal  Soft,nontender, without masses, guarding or rebound.  Liver/spleen:  No organomegaly noted  Hernia:  None appreciated  Skin  Inspection:  Grossly normal  Palpation:  Grossly normal Neurologic/psychiatric  Orientation:  Normal with appropriate conversation.  Mood/affect:  Normal  Genitourinary    Breasts: Examined lying and sitting.     Right: Without masses, retractions, discharge or axillary adenopathy.     Left: Without masses, retractions, discharge or axillary adenopathy.   Inguinal/mons:  Normal without inguinal adenopathy  External genitalia:  Normal  BUS/Urethra/Skene's glands:  Normal  Bladder:  Normal  Vagina:  Refused pelvic exam today was on her cycle.  Assessment/Plan:  37 y.o. MBF G0 for annual exam desiring conception.  Normal GYN exam Desiring  conception/off OC's 5 months Rubella equivocal 2013  Plan: Conceptual timing reviewed, reviewed importance of increasing frequency of intercourse day 7 through  46. Reviewed if does not conceive within the year to return to office for further testing. CBC, glucose, TSH, prolactin, UA, Pap normal 2013. SBE's, exercise, calcium rich diet, MVI daily, and decrease calories for weight loss and health.      Harrington Challenger [facility], 5:03 PM 11/14/2012

## 2012-11-14 NOTE — Patient Instructions (Addendum)

## 2012-11-15 ENCOUNTER — Other Ambulatory Visit: Payer: Self-pay | Admitting: Women's Health

## 2012-11-15 LAB — CBC WITH DIFFERENTIAL/PLATELET
Basophils Relative: 1 % (ref 0–1)
Eosinophils Absolute: 0.3 10*3/uL (ref 0.0–0.7)
HCT: 34.5 % — ABNORMAL LOW (ref 36.0–46.0)
Hemoglobin: 11 g/dL — ABNORMAL LOW (ref 12.0–15.0)
MCH: 25.9 pg — ABNORMAL LOW (ref 26.0–34.0)
MCHC: 31.9 g/dL (ref 30.0–36.0)
Monocytes Absolute: 0.5 10*3/uL (ref 0.1–1.0)
Monocytes Relative: 8 % (ref 3–12)
RDW: 16.9 % — ABNORMAL HIGH (ref 11.5–15.5)

## 2012-11-15 LAB — GLUCOSE, RANDOM: Glucose, Bld: 83 mg/dL (ref 70–99)

## 2012-11-15 LAB — URINALYSIS W MICROSCOPIC + REFLEX CULTURE
Bilirubin Urine: NEGATIVE
Casts: NONE SEEN
Crystals: NONE SEEN
Nitrite: NEGATIVE
RBC / HPF: >50 RBC/hpf — AB (ref <3)
Specific Gravity, Urine: 1.025 (ref 1.005–1.030)
Squamous Epithelial / LPF: NONE SEEN
Urobilinogen, UA: 0.2 mg/dL (ref 0.0–1.0)
pH: 7 (ref 5.0–8.0)

## 2012-11-16 LAB — URINE CULTURE: Colony Count: 70000

## 2012-11-23 ENCOUNTER — Other Ambulatory Visit: Payer: Commercial Managed Care - PPO

## 2013-01-16 ENCOUNTER — Emergency Department (INDEPENDENT_AMBULATORY_CARE_PROVIDER_SITE_OTHER)
Admission: EM | Admit: 2013-01-16 | Discharge: 2013-01-16 | Disposition: A | Discharge status: Home / Self Care | Payer: Commercial Managed Care - PPO | Source: Home / Self Care | Attending: Family Medicine | Admitting: Family Medicine

## 2013-01-16 ENCOUNTER — Encounter (HOSPITAL_COMMUNITY): Payer: Self-pay | Admitting: *Deleted

## 2013-01-16 DIAGNOSIS — J309 Allergic rhinitis, unspecified: Secondary | ICD-10-CM

## 2013-01-16 DIAGNOSIS — J300 Vasomotor rhinitis: Secondary | ICD-10-CM

## 2013-01-16 MED ORDER — FLUTICASONE PROPIONATE 50 MCG/ACT NA SUSP: 1.0000 | Freq: Two times a day (BID) | NASAL | Status: DC

## 2013-01-16 MED ORDER — METHYLPREDNISOLONE ACETATE 40 MG/ML IJ SUSP
80.0000 mg | Freq: Once | INTRAMUSCULAR | Status: AC
  Administered 2013-01-16: 80 mg via INTRAMUSCULAR

## 2013-01-16 MED ORDER — METHYLPREDNISOLONE ACETATE 80 MG/ML IJ SUSP
INTRAMUSCULAR | Status: AC
  Filled 2013-01-16: qty 1

## 2013-01-16 NOTE — ED Notes (Signed)
C/o throat felt congested on Sat.  C/o headache, pressure in her sinuses at night and can't breathe through her nose and runny nose in the daytime.  Coughs at night. C/o her eyes hurting due to pressure.

## 2013-01-16 NOTE — ED Provider Notes (Signed)
   History    CSN: [id number] Arrival date & time 01/16/13  1641  First MD Initiated Contact with Patient 01/16/13 1716     Chief Complaint  Patient presents with  . URI   (Consider location/radiation/quality/duration/timing/severity/associated sxs/prior Treatment) Patient is a 37 y.o. female presenting with URI. The history is provided by the patient.  URI Presenting symptoms: congestion, cough and rhinorrhea   Presenting symptoms: no fever and no sore throat   Severity:  Mild Duration:  3 days Chronicity:  New Relieved by:  None tried Worsened by:  Nothing tried Ineffective treatments:  None tried Associated symptoms: sinus pain    Past Medical History  Diagnosis Date  . Diabetes mellitus 3-13    BORDERLINE  . Anemia 3-13    TOLD TO TAKE IRON PILLS   Past Surgical History  Procedure Laterality Date  . Inner ear surgery  AS A CHILD    DOES NOT REMEMBER WHICH EAR   Family History  Problem Relation Age of Onset  . Hypertension Father   . Diabetes Father    History  Substance Use Topics  . Smoking status: Never Smoker   . Smokeless tobacco: Never Used  . Alcohol Use: No     Comment: Rare   OB History   Grav Para Term Preterm Abortions TAB SAB Ect Mult Living   0              Review of Systems  Constitutional: Negative.  Negative for fever.  HENT: Positive for congestion, rhinorrhea and postnasal drip. Negative for sore throat.   Respiratory: Positive for cough. Negative for shortness of breath.     Allergies  Review of patient's allergies indicates no known allergies.  Home Medications   Current Outpatient Rx  Name  Route  Sig  Dispense  Refill  . guaiFENesin (MUCINEX) 600 MG 12 hr tablet   Oral   Take 1,200 mg by mouth 2 (two) times daily.         Marland Kitchen ibuprofen (ADVIL,MOTRIN) 600 MG tablet   Oral   Take 1 tablet (600 mg total) by mouth every 8 (eight) hours as needed for pain.   60 tablet   1   . fluticasone (FLONASE) 50 MCG/ACT nasal spray  Nasal   Place 1 spray into the nose 2 (two) times daily.   1 g   2    BP 157/84  Pulse 93  Temp(Src) 98.3 F (36.8 C) (Oral)  Resp 16  SpO2 97%  LMP 01/01/2013 Physical Exam  Nursing note and vitals reviewed. Constitutional: She is oriented to person, place, and time. She appears well-developed and well-nourished.  HENT:  Head: Normocephalic.  Right Ear: External ear normal.  Left Ear: External ear normal.  Nose: Mucosal edema and rhinorrhea present. Right sinus exhibits frontal sinus tenderness. Left sinus exhibits frontal sinus tenderness.  Mouth/Throat: Oropharynx is clear and moist.  Neck: Normal range of motion. Neck supple.  Cardiovascular: Normal rate and regular rhythm.   Pulmonary/Chest: Breath sounds normal.  Lymphadenopathy:    She has no cervical adenopathy.  Neurological: She is alert and oriented to person, place, and time.  Skin: Skin is warm and dry.    ED Course  Procedures (including critical care time) Labs Reviewed - No data to display No results found. 1. Vasomotor rhinitis     MDM    Linna Hoff, MD 01/16/13 1745

## 2013-01-18 LAB — CULTURE, GROUP A STREP

## 2013-02-22 ENCOUNTER — Ambulatory Visit (INDEPENDENT_AMBULATORY_CARE_PROVIDER_SITE_OTHER): Payer: Commercial Managed Care - PPO | Admitting: Licensed Clinical Social Worker

## 2013-02-22 ENCOUNTER — Ambulatory Visit: Payer: Commercial Managed Care - PPO | Admitting: Licensed Clinical Social Worker

## 2013-02-22 DIAGNOSIS — F411 Generalized anxiety disorder: Secondary | ICD-10-CM

## 2013-02-22 DIAGNOSIS — F331 Major depressive disorder, recurrent, moderate: Secondary | ICD-10-CM

## 2013-02-26 ENCOUNTER — Emergency Department (INDEPENDENT_AMBULATORY_CARE_PROVIDER_SITE_OTHER)
Admission: EM | Admit: 2013-02-26 | Discharge: 2013-02-26 | Disposition: A | Discharge status: Home / Self Care | Payer: Commercial Managed Care - PPO | Source: Home / Self Care

## 2013-02-26 ENCOUNTER — Encounter (HOSPITAL_COMMUNITY): Payer: Self-pay | Admitting: Emergency Medicine

## 2013-02-26 ENCOUNTER — Ambulatory Visit (INDEPENDENT_AMBULATORY_CARE_PROVIDER_SITE_OTHER): Payer: Commercial Managed Care - PPO | Admitting: Licensed Clinical Social Worker

## 2013-02-26 DIAGNOSIS — G44209 Tension-type headache, unspecified, not intractable: Secondary | ICD-10-CM

## 2013-02-26 DIAGNOSIS — F411 Generalized anxiety disorder: Secondary | ICD-10-CM

## 2013-02-26 DIAGNOSIS — Z3202 Encounter for pregnancy test, result negative: Secondary | ICD-10-CM

## 2013-02-26 DIAGNOSIS — F4310 Post-traumatic stress disorder, unspecified: Secondary | ICD-10-CM

## 2013-02-26 DIAGNOSIS — F331 Major depressive disorder, recurrent, moderate: Secondary | ICD-10-CM

## 2013-02-26 DIAGNOSIS — R110 Nausea: Secondary | ICD-10-CM

## 2013-02-26 LAB — POCT PREGNANCY, URINE: Preg Test, Ur: NEGATIVE

## 2013-02-26 MED ORDER — ASPIRIN-ACETAMINOPHEN-CAFFEINE 250-250-65 MG PO TABS: 2.0000 | ORAL_TABLET | Freq: Four times a day (QID) | ORAL | Status: DC | PRN

## 2013-02-26 MED ORDER — ONDANSETRON 4 MG PO TBDP
ORAL_TABLET | ORAL | Status: AC
  Filled 2013-02-26: qty 2

## 2013-02-26 MED ORDER — ONDANSETRON 4 MG PO TBDP
8.0000 mg | ORAL_TABLET | Freq: Once | ORAL | Status: AC
  Administered 2013-02-26: 8 mg via ORAL

## 2013-02-26 NOTE — ED Provider Notes (Signed)
Medical screening examination/treatment/procedure(s) were performed by a resident physician or non-physician practitioner and as the supervising physician I was immediately available for consultation/collaboration.  Evan Corey, MD   Evan S Corey, MD 02/26/13 2009 

## 2013-02-26 NOTE — ED Provider Notes (Signed)
CSN: [id number]     Arrival date & time 02/26/13  1107 History   First MD Initiated Contact with Patient 02/26/13 1148     Chief Complaint  Patient presents with  . Headache    off and on x 1wk   (Consider location/radiation/quality/duration/timing/severity/associated sxs/prior Treatment) HPI Comments: 37 year old female presents complaining of mild headache, mild nausea. She has had a headache on and off, starting initially many years ago. This headache is not any worse or lasting any longer than any headache she's ever had. The pain is in the same place as always. It is bilateral across the back of her head and slight in the middle of her forehead. When she has this headache, she will usually take ibuprofen or just wait until it goes away. This particular headache began last night. She has taken ibuprofen which did help. The nausea began this morning it is very mild. She has not actually vomited. She denies any abdominal pain, diarrhea, blurry vision, dizziness, or any other symptoms. Her husband has requested that she get a pregnancy test will she is here. She did not have any symptoms to suggest that she might be pregnant. Her last period was one week ago and was heavy  Patient is a 37 y.o. female presenting with headaches.  Headache Associated symptoms: nausea   Associated symptoms: no abdominal pain, no cough, no dizziness, no fever, no myalgias and no vomiting     Past Medical History  Diagnosis Date  . Diabetes mellitus 3-13    BORDERLINE  . Anemia 3-13    TOLD TO TAKE IRON PILLS   Past Surgical History  Procedure Laterality Date  . Inner ear surgery  AS A CHILD    DOES NOT REMEMBER WHICH EAR   Family History  Problem Relation Age of Onset  . Hypertension Father   . Diabetes Father    History  Substance Use Topics  . Smoking status: Never Smoker   . Smokeless tobacco: Never Used  . Alcohol Use: No     Comment: Rare   OB History   Grav Para Term Preterm Abortions TAB  SAB Ect Mult Living   0              Review of Systems  Constitutional: Negative for fever and chills.  Eyes: Negative for visual disturbance.  Respiratory: Negative for cough and shortness of breath.   Cardiovascular: Negative for chest pain, palpitations and leg swelling.  Gastrointestinal: Positive for nausea. Negative for vomiting and abdominal pain.  Endocrine: Negative for polydipsia and polyuria.  Genitourinary: Negative for dysuria, urgency and frequency.  Musculoskeletal: Negative for myalgias and arthralgias.  Skin: Negative for rash.  Neurological: Positive for speech difficulty and headaches. Negative for dizziness, weakness and light-headedness.    Allergies  Review of patient's allergies indicates no known allergies.  Home Medications   Current Outpatient Rx  Name  Route  Sig  Dispense  Refill  . ibuprofen (ADVIL,MOTRIN) 600 MG tablet   Oral   Take 1 tablet (600 mg total) by mouth every 8 (eight) hours as needed for pain.   60 tablet   1   . aspirin-acetaminophen-caffeine (EXCEDRIN MIGRAINE) 250-250-65 MG per tablet   Oral   Take 2 tablets by mouth every 6 (six) hours as needed for pain.   30 tablet   0   . fluticasone (FLONASE) 50 MCG/ACT nasal spray   Nasal   Place 1 spray into the nose 2 (two) times daily.  1 g   2   . guaiFENesin (MUCINEX) 600 MG 12 hr tablet   Oral   Take 1,200 mg by mouth 2 (two) times daily.          BP 127/70  Pulse 75  Temp(Src) 98.2 F (36.8 C) (Oral)  Resp 16  SpO2 98%  LMP 02/19/2013 Physical Exam  Nursing note and vitals reviewed. Constitutional: She is oriented to person, place, and time. She appears well-developed and well-nourished. No distress.  HENT:  Head: Normocephalic and atraumatic.  Right Ear: External ear normal.  Left Ear: External ear normal.  Mouth/Throat: Oropharynx is clear and moist.  Eyes: Conjunctivae and EOM are normal. Pupils are equal, round, and reactive to light.  Neck: Neck supple.   Cardiovascular: Normal rate, regular rhythm and normal heart sounds.   Pulmonary/Chest: Effort normal and breath sounds normal. She has no wheezes. She has no rales. She exhibits no tenderness.  Abdominal: Soft. She exhibits no mass. There is no tenderness. There is no rebound and no guarding.  Lymphadenopathy:    She has no cervical adenopathy.  Neurological: She is alert and oriented to person, place, and time. No cranial nerve deficit. She exhibits normal muscle tone. Coordination normal.  Skin: Skin is warm and dry. No rash noted. She is not diaphoretic.  Psychiatric: She has a normal mood and affect. Judgment normal.    ED Course  Procedures (including critical care time) Labs Review Labs Reviewed  POCT PREGNANCY, URINE   Imaging Review No results found.  MDM   1. Tension headache   2. Negative pregnancy test   3. Nausea    Physical exam and comprehensive neurologic exam are normal. Urine pregnancy test is negative. She can try Excedrin Migraine for the headache instead of ibuprofen. Giving 8 mg of Zofran here for nausea, followup if this does not resolve    Graylon Good, PA-C 02/26/13 1249

## 2013-02-26 NOTE — ED Notes (Signed)
C/o headache off/on for a wk. States pain is felt at the back of the head and its a throbbing sensation comes goes. Mild nausea. Some trouble sleeping.  Denies blurred or spotty vision. Pt has taking 400 mg of ibuprofen

## 2013-03-01 ENCOUNTER — Ambulatory Visit: Payer: Commercial Managed Care - PPO | Admitting: Licensed Clinical Social Worker

## 2013-03-11 ENCOUNTER — Ambulatory Visit: Payer: Commercial Managed Care - PPO | Admitting: Licensed Clinical Social Worker

## 2013-04-04 ENCOUNTER — Ambulatory Visit: Payer: Commercial Managed Care - PPO | Admitting: Licensed Clinical Social Worker

## 2013-05-02 ENCOUNTER — Other Ambulatory Visit: Payer: Self-pay

## 2013-12-23 ENCOUNTER — Ambulatory Visit: Payer: Commercial Managed Care - PPO | Admitting: Licensed Clinical Social Worker

## 2014-01-08 ENCOUNTER — Encounter: Payer: Commercial Managed Care - PPO | Admitting: Women's Health

## 2014-02-06 ENCOUNTER — Encounter: Payer: Commercial Managed Care - PPO | Admitting: Women's Health

## 2014-03-04 ENCOUNTER — Encounter: Payer: Commercial Managed Care - PPO | Admitting: Women's Health

## 2014-06-20 ENCOUNTER — Encounter (HOSPITAL_COMMUNITY): Payer: Self-pay | Admitting: Emergency Medicine

## 2014-06-20 ENCOUNTER — Emergency Department (HOSPITAL_COMMUNITY)
Admission: EM | Admit: 2014-06-20 | Discharge: 2014-06-20 | Disposition: A | Discharge status: Home / Self Care | Payer: BC Managed Care – PPO | Attending: Emergency Medicine | Admitting: Emergency Medicine

## 2014-06-20 DIAGNOSIS — Z8639 Personal history of other endocrine, nutritional and metabolic disease: Secondary | ICD-10-CM | POA: Insufficient documentation

## 2014-06-20 DIAGNOSIS — K088 Other specified disorders of teeth and supporting structures: Secondary | ICD-10-CM | POA: Diagnosis present

## 2014-06-20 DIAGNOSIS — K047 Periapical abscess without sinus: Secondary | ICD-10-CM | POA: Diagnosis not present

## 2014-06-20 DIAGNOSIS — Z79899 Other long term (current) drug therapy: Secondary | ICD-10-CM | POA: Insufficient documentation

## 2014-06-20 DIAGNOSIS — Z862 Personal history of diseases of the blood and blood-forming organs and certain disorders involving the immune mechanism: Secondary | ICD-10-CM | POA: Diagnosis not present

## 2014-06-20 DIAGNOSIS — K029 Dental caries, unspecified: Secondary | ICD-10-CM | POA: Insufficient documentation

## 2014-06-20 MED ORDER — PENICILLIN V POTASSIUM 500 MG PO TABS
500.0000 mg | ORAL_TABLET | Freq: Once | ORAL | Status: AC
  Administered 2014-06-20: 500 mg via ORAL
  Filled 2014-06-20: qty 1

## 2014-06-20 MED ORDER — PENICILLIN V POTASSIUM 500 MG PO TABS: 500.0000 mg | ORAL_TABLET | Freq: Once | ORAL | Status: DC

## 2014-06-20 MED ORDER — TRAMADOL HCL 50 MG PO TABS
50.0000 mg | ORAL_TABLET | Freq: Once | ORAL | Status: AC
  Administered 2014-06-20: 50 mg via ORAL
  Filled 2014-06-20: qty 1

## 2014-06-20 MED ORDER — TRAMADOL HCL 50 MG PO TABS: 50.0000 mg | ORAL_TABLET | Freq: Four times a day (QID) | ORAL | Status: DC | PRN

## 2014-06-20 NOTE — ED Notes (Signed)
Pt arrived to the ED with dental pain. Pt has a right upper tooth broken and a painful tooth on the right lower.  Pain has been present since Monday

## 2014-06-20 NOTE — Discharge Instructions (Signed)
Abscessed Tooth An abscessed tooth is an infection around your tooth. It may be caused by holes or damage to the tooth (cavity) or a dental disease. An abscessed tooth causes mild to very bad pain in and around the tooth. See your dentist right away if you have tooth or gum pain. HOME CARE  Take your medicine as told. Finish it even if you start to feel better.  Do not drive after taking pain medicine.  Rinse your mouth (gargle) often with salt water ( teaspoon salt in 8 ounces of warm water).  Do not apply heat to the outside of your face. GET HELP RIGHT AWAY IF:   You have a temperature by mouth above 102 F (38.9 C), not controlled by medicine.  You have chills and a very bad headache.  You have problems breathing or swallowing.  Your mouth will not open.  You develop puffiness (swelling) on the neck or around the eye.  Your pain is not helped by medicine.  Your pain is getting worse instead of better. MAKE SURE YOU:   Understand these instructions.  Will watch your condition.  Will get help right away if you are not doing well or get worse. Document Released: 11/30/2007 Document Revised: 09/05/2011 Document Reviewed: 09/21/2010 [facility] Patient Information 2015 [place], [place]. This information is not intended to replace advice given to you by your health care provider. Make sure you discuss any questions you have with your health care provider.  Dental Care and Dentist Visits Dental care supports good overall health. Regular dental visits can also help you avoid dental pain, bleeding, infection, and other more serious health problems in the future. It is important to keep the mouth healthy because diseases in the teeth, gums, and other oral tissues can spread to other areas of the body. Some problems, such as diabetes, heart disease, and pre-term labor have been associated with poor oral health.  See your dentist every 6 months. If you experience emergency problems such  as a toothache or broken tooth, go to the dentist right away. If you see your dentist regularly, you may catch problems early. It is easier to be treated for problems in the early stages.  WHAT TO EXPECT AT A DENTIST VISIT  Your dentist will look for many common oral health problems and recommend proper treatment. At your regular dental visit, you can expect:  Gentle cleaning of the teeth and gums. This includes scraping and polishing. This helps to remove the sticky substance around the teeth and gums (plaque). Plaque forms in the mouth shortly after eating. Over time, plaque hardens on the teeth as tartar. If tartar is not removed regularly, it can cause problems. Cleaning also helps remove stains.  Periodic X-rays. These pictures of the teeth and supporting bone will help your dentist assess the health of your teeth.  Periodic fluoride treatments. Fluoride is a natural mineral shown to help strengthen teeth. Fluoride treatmentinvolves applying a fluoride gel or varnish to the teeth. It is most commonly done in children.  Examination of the mouth, tongue, jaws, teeth, and gums to look for any oral health problems, such as:  Cavities (dental caries). This is decay on the tooth caused by plaque, sugar, and acid in the mouth. It is best to catch a cavity when it is small.  Inflammation of the gums caused by plaque buildup (gingivitis).  Problems with the mouth or malformed or misaligned teeth.  Oral cancer or other diseases of the soft tissues or jaws.  KEEP YOUR TEETH AND GUMS HEALTHY For healthy teeth and gums, follow these general guidelines as well as your dentist's specific advice:  Have your teeth professionally cleaned at the dentist every 6 months.  Brush twice daily with a fluoride toothpaste.  Floss your teeth daily.  Ask your dentist if you need fluoride supplements, treatments, or fluoride toothpaste.  Eat a healthy diet. Reduce foods and drinks with added sugar.  Avoid  smoking. TREATMENT FOR ORAL HEALTH PROBLEMS If you have oral health problems, treatment varies depending on the conditions present in your teeth and gums.  Your caregiver will most likely recommend good oral hygiene at each visit.  For cavities, gingivitis, or other oral health disease, your caregiver will perform a procedure to treat the problem. This is typically done at a separate appointment. Sometimes your caregiver will refer you to another dental specialist for specific tooth problems or for surgery. SEEK IMMEDIATE DENTAL CARE IF:  You have pain, bleeding, or soreness in the gum, tooth, jaw, or mouth area.  A permanent tooth becomes loose or separated from the gum socket.  You experience a blow or injury to the mouth or jaw area. Document Released: 02/23/2011 Document Revised: 09/05/2011 Document Reviewed: 02/23/2011 [facility] Patient Information 2015 [place], [place]. This information is not intended to replace advice given to you by your health care provider. Make sure you discuss any questions you have with your health care provider. Please call Dr. Geralynn Ochs office first thing on Monday morning, telling them you referred to the department

## 2014-06-20 NOTE — ED Provider Notes (Signed)
CSN: [id number]     Arrival date & time 06/20/14  0430 History   First MD Initiated Contact with Patient 06/20/14 [phone]     Chief Complaint  Patient presents with  . Dental Pain     (Consider location/radiation/quality/duration/timing/severity/associated sxs/prior Treatment) HPI Comments: [place], S2 upper right molars that have been decayed for a while now causing discomfort does not have a dentist.  Has been taking over-the-counter Tylenol products without relief  Patient is a 38 y.o. female presenting with tooth pain. The history is provided by the patient.  Dental Pain Location:  Upper Upper teeth location:  2/RU 2nd molar and 3/RU 1st molar Quality:  Dull and pulsating Severity:  Moderate Onset quality:  Gradual Timing:  Constant Progression:  Worsening Chronicity:  New Context: dental caries   Relieved by:  Nothing Worsened by:  Cold food/drink and hot food/drink Ineffective treatments:  Acetaminophen and NSAIDs Associated symptoms: no facial pain, no facial swelling, no fever and no headaches     Past Medical History  Diagnosis Date  . Diabetes mellitus 3-13    BORDERLINE  . Anemia 3-13    TOLD TO TAKE IRON PILLS   Past Surgical History  Procedure Laterality Date  . Inner ear surgery  AS A CHILD    DOES NOT REMEMBER WHICH EAR   Family History  Problem Relation Age of Onset  . Hypertension Father   . Diabetes Father    History  Substance Use Topics  . Smoking status: Never Smoker   . Smokeless tobacco: Never Used  . Alcohol Use: No     Comment: Rare   OB History    Gravida Para Term Preterm AB TAB SAB Ectopic Multiple Living   0              Review of Systems  Constitutional: Negative for fever.  HENT: Positive for dental problem. Negative for facial swelling, rhinorrhea and sinus pressure.   Neurological: Negative for dizziness and headaches.  All other systems reviewed and are negative.     Allergies  Review of patient's allergies indicates  no known allergies.  Home Medications   Prior to Admission medications   Medication Sig Start Date End Date Taking? Authorizing Provider  acetaminophen (TYLENOL) 500 MG tablet Take 500 mg by mouth every 6 (six) hours as needed for mild pain.   Yes Historical Provider, MD  aspirin-acetaminophen-caffeine (EXCEDRIN MIGRAINE) [phone] MG per tablet Take 2 tablets by mouth every 6 (six) hours as needed for pain. Patient taking differently: Take 2 tablets by mouth every 6 (six) hours as needed for pain.  02/26/13   Freeman Caldron Baker, PA-C  fluticasone (FLONASE) 50 MCG/ACT nasal spray Place 1 spray into the nose 2 (two) times daily. Patient not taking: Reported on 06/20/2014 01/16/13   Billy Fischer, MD  guaiFENesin (MUCINEX) 600 MG 12 hr tablet Take 1,200 mg by mouth 2 (two) times daily.    Historical Provider, MD  ibuprofen (ADVIL,MOTRIN) 600 MG tablet Take 1 tablet (600 mg total) by mouth every 8 (eight) hours as needed for pain. Patient not taking: Reported on 06/20/2014 11/14/12   Huel Cote, NP  penicillin v potassium (VEETID) 500 MG tablet Take 1 tablet (500 mg total) by mouth once. 06/20/14   Garald Balding, NP  traMADol (ULTRAM) 50 MG tablet Take 1 tablet (50 mg total) by mouth every 6 (six) hours as needed. 06/20/14   Garald Balding, NP   BP 145/86 mmHg  Pulse  71  Temp(Src) 97.5 F (36.4 C) (Oral)  Resp 16  SpO2 100%  LMP 06/04/2014 Physical Exam  Constitutional: She is oriented to person, place, and time. She appears well-developed and well-nourished.  HENT:  Head: Normocephalic.  Mouth/Throat:    Eyes: Pupils are equal, round, and reactive to light.  Neck: Normal range of motion.  Cardiovascular: Normal rate.   Pulmonary/Chest: Effort normal.  Musculoskeletal: Normal range of motion.  Lymphadenopathy:    She has no cervical adenopathy.  Neurological: She is alert and oriented to person, place, and time.  Skin: Skin is warm.  Nursing note and vitals reviewed.   ED Course    Procedures (including critical care time) Labs Review Labs Reviewed - No data to display  Imaging Review No results found.   EKG Interpretation None      MDM   Final diagnoses:  Dental decay  Dental abscess        Garald Balding, NP 06/20/14 0543  Garald Balding, NP 06/20/14 9470  Janice Norrie, MD 06/20/14 [phone]

## 2014-08-07 ENCOUNTER — Encounter: Payer: Self-pay | Admitting: Women's Health

## 2014-08-07 ENCOUNTER — Ambulatory Visit (INDEPENDENT_AMBULATORY_CARE_PROVIDER_SITE_OTHER): Payer: BLUE CROSS/BLUE SHIELD | Admitting: Women's Health

## 2014-08-07 ENCOUNTER — Other Ambulatory Visit (HOSPITAL_COMMUNITY)
Admission: RE | Admit: 2014-08-07 | Discharge: 2014-08-07 | Disposition: A | Discharge status: Home / Self Care | Payer: BLUE CROSS/BLUE SHIELD | Source: Ambulatory Visit | Attending: Gynecology | Admitting: Gynecology

## 2014-08-07 VITALS — BP 124/80 | Ht 64.0 in | Wt 172.0 lb

## 2014-08-07 DIAGNOSIS — Z13220 Encounter for screening for lipoid disorders: Secondary | ICD-10-CM | POA: Diagnosis not present

## 2014-08-07 DIAGNOSIS — Z01419 Encounter for gynecological examination (general) (routine) without abnormal findings: Secondary | ICD-10-CM | POA: Insufficient documentation

## 2014-08-07 DIAGNOSIS — Z833 Family history of diabetes mellitus: Secondary | ICD-10-CM | POA: Diagnosis not present

## 2014-08-07 DIAGNOSIS — B3731 Acute candidiasis of vulva and vagina: Secondary | ICD-10-CM

## 2014-08-07 DIAGNOSIS — B373 Candidiasis of vulva and vagina: Secondary | ICD-10-CM | POA: Diagnosis not present

## 2014-08-07 DIAGNOSIS — N898 Other specified noninflammatory disorders of vagina: Secondary | ICD-10-CM | POA: Diagnosis not present

## 2014-08-07 LAB — WET PREP FOR TRICH, YEAST, CLUE
CLUE CELLS WET PREP: NONE SEEN
TRICH WET PREP: NONE SEEN
WBC WET PREP: NONE SEEN

## 2014-08-07 MED ORDER — FLUCONAZOLE 150 MG PO TABS: 150.0000 mg | ORAL_TABLET | Freq: Once | ORAL | Status: DC

## 2014-08-07 NOTE — Progress Notes (Signed)
Karen Pollard 1976/02/27 [id number]    History:    Presents for annual exam.  Monthly cycle, no contraception, pregnancy ok.  Normal pap history. Dental abscess 06/20/2014 but has not followed up.  Past medical history, past surgical history, family history and social history were all reviewed and documented in the EPIC chart. 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 Father diabetes and hypertension. Mother healthy.  ROS:  A ROS was performed and pertinent positives and negatives are included.  Exam:  Filed Vitals:   08/07/14 1601  BP: 124/80    General appearance:  Normal Thyroid:  Symmetrical, normal in size, without palpable masses or nodularity. Respiratory  Auscultation:  Clear without wheezing or rhonchi Cardiovascular  Auscultation:  Regular rate, without rubs, murmurs or gallops  Edema/varicosities:  Not grossly evident Abdominal  Soft,nontender, without masses, guarding or rebound.  Liver/spleen:  No organomegaly noted  Hernia:  None appreciated  Skin  Inspection:  Grossly normal   Breasts: Examined lying and sitting.     Right: Without masses, retractions, discharge or axillary adenopathy.     Left: Without masses, retractions, discharge or axillary adenopathy. Gentitourinary   Inguinal/mons:  Normal without inguinal adenopathy  External genitalia:  Normal  BUS/Urethra/Skene's glands:  Normal  Vagina:  Mild erythema, wet prep positive for yeast  Cervix:  Normal  Uterus:  normal in size, shape and contour.  Midline and mobile  Adnexa/parametria:     Rt: Without masses or tenderness.   Lt: Without masses or tenderness.  Anus and perineum: Normal  Digital rectal exam: Normal sphincter tone without palpated masses or tenderness  Assessment/Plan:  39 y.o. MBF G0  for annual exam complaint of discharge.  Yeast vaginitis Monthly cycle using no contraception pregnancy okay  Plan: Diflucan 150 by mouth 1 dose, prescription, proper use given and reviewed.  Conception/ovulation timing reviewed. Declines fertility intervention.  Return to office with missed cycle for viability ultrasound. Aware we no longer deliver.SBE's, exercise, calcium rich diet, MVI daily encouraged. CBC, glucose, lipid panel, UA, Pap with HR HPV typing. Pap normal 2013, new screening guidelines reviewed.  Huel Cote [facility], 4:26 PM 08/07/2014

## 2014-08-07 NOTE — Patient Instructions (Signed)

## 2014-08-08 LAB — CBC WITH DIFFERENTIAL/PLATELET
BASOS PCT: 1 % (ref 0–1)
Basophils Absolute: 0.1 10*3/uL (ref 0.0–0.1)
EOS ABS: 0.4 10*3/uL (ref 0.0–0.7)
EOS PCT: 6 % — AB (ref 0–5)
HEMATOCRIT: 34.5 % — AB (ref 36.0–46.0)
Hemoglobin: 11.1 g/dL — ABNORMAL LOW (ref 12.0–15.0)
LYMPHS PCT: 29 % (ref 12–46)
Lymphs Abs: 1.8 10*3/uL (ref 0.7–4.0)
MCH: 26.2 pg (ref 26.0–34.0)
MCHC: 32.2 g/dL (ref 30.0–36.0)
MCV: 81.4 fL (ref 78.0–100.0)
MONOS PCT: 8 % (ref 3–12)
MPV: 11.5 fL (ref 8.6–12.4)
Monocytes Absolute: 0.5 10*3/uL (ref 0.1–1.0)
NEUTROS ABS: 3.5 10*3/uL (ref 1.7–7.7)
Neutrophils Relative %: 56 % (ref 43–77)
PLATELETS: 282 10*3/uL (ref 150–400)
RBC: 4.24 MIL/uL (ref 3.87–5.11)
RDW: 16.1 % — ABNORMAL HIGH (ref 11.5–15.5)
WBC: 6.2 10*3/uL (ref 4.0–10.5)

## 2014-08-08 LAB — URINALYSIS W MICROSCOPIC + REFLEX CULTURE
BILIRUBIN URINE: NEGATIVE
Bacteria, UA: NONE SEEN
CASTS: NONE SEEN
CRYSTALS: NONE SEEN
GLUCOSE, UA: NEGATIVE mg/dL
HGB URINE DIPSTICK: NEGATIVE
KETONES UR: NEGATIVE mg/dL
LEUKOCYTES UA: NEGATIVE
NITRITE: NEGATIVE
PH: 6 (ref 5.0–8.0)
Protein, ur: NEGATIVE mg/dL
SPECIFIC GRAVITY, URINE: 1.028 (ref 1.005–1.030)
Squamous Epithelial / LPF: NONE SEEN
Urobilinogen, UA: 1 mg/dL (ref 0.0–1.0)

## 2014-08-08 LAB — LIPID PANEL
CHOL/HDL RATIO: 3 ratio
CHOLESTEROL: 198 mg/dL (ref 0–200)
HDL: 65 mg/dL (ref >39)
LDL CALC: 122 mg/dL — AB (ref 0–99)
Triglycerides: 54 mg/dL (ref <150)
VLDL: 11 mg/dL (ref 0–40)

## 2014-08-08 LAB — GLUCOSE, RANDOM: Glucose, Bld: 79 mg/dL (ref 70–99)

## 2014-08-08 LAB — TSH: TSH: 1.109 u[IU]/mL (ref 0.350–4.500)

## 2014-08-11 LAB — CYTOLOGY - PAP

## 2014-12-16 ENCOUNTER — Ambulatory Visit (INDEPENDENT_AMBULATORY_CARE_PROVIDER_SITE_OTHER): Payer: BLUE CROSS/BLUE SHIELD

## 2014-12-16 ENCOUNTER — Encounter: Payer: Self-pay | Admitting: Women's Health

## 2014-12-16 ENCOUNTER — Ambulatory Visit (INDEPENDENT_AMBULATORY_CARE_PROVIDER_SITE_OTHER): Payer: BLUE CROSS/BLUE SHIELD | Admitting: Women's Health

## 2014-12-16 ENCOUNTER — Other Ambulatory Visit: Payer: Self-pay | Admitting: Women's Health

## 2014-12-16 ENCOUNTER — Other Ambulatory Visit: Payer: BLUE CROSS/BLUE SHIELD

## 2014-12-16 VITALS — BP 120/70 | Ht 64.0 in | Wt 174.0 lb

## 2014-12-16 DIAGNOSIS — O34591 Maternal care for other abnormalities of gravid uterus, first trimester: Secondary | ICD-10-CM

## 2014-12-16 DIAGNOSIS — D251 Intramural leiomyoma of uterus: Secondary | ICD-10-CM

## 2014-12-16 DIAGNOSIS — Z36 Encounter for antenatal screening of mother: Secondary | ICD-10-CM

## 2014-12-16 DIAGNOSIS — N831 Corpus luteum cyst of ovary, unspecified side: Secondary | ICD-10-CM

## 2014-12-16 DIAGNOSIS — N912 Amenorrhea, unspecified: Secondary | ICD-10-CM | POA: Diagnosis not present

## 2014-12-16 DIAGNOSIS — O3680X0 Pregnancy with inconclusive fetal viability, not applicable or unspecified: Secondary | ICD-10-CM

## 2014-12-16 DIAGNOSIS — Q5122 Other partial doubling of uterus: Secondary | ICD-10-CM

## 2014-12-16 DIAGNOSIS — Z3689 Encounter for other specified antenatal screening: Secondary | ICD-10-CM

## 2014-12-16 LAB — PREGNANCY, URINE: PREG TEST UR: POSITIVE

## 2014-12-16 NOTE — Progress Notes (Signed)
Patient ID: Karen Pollard, female   DOB: [birth year], 39 y.o.   MRN: [id number] Presents with positive home UPT, LMP May 3, normal cycle. Denies abdominal pain, bleeding, spotting or discharge. Reports bilateral breast tenderness.  Using no contraception for greater then one year. Started taking prenatal vitamin daily recently. First pregnancy, pleased with pregnancy.  Exam: Appears well. Abdomen soft, obese. Ultrasound: T/V and T/A images. 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 Normal yolk sac, right ovary 2 corpus luteum cyst 15 mm, 18 mm. Left ovary normal. Cervix long and closed. Fibroids 10 mm, 13 x 8 mm, 12 x 15 mm, cystic lumen suggesting degenerating fibroid. 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 Prenatal vitamin daily. Safe pregnancy behaviors reviewed. Congratulations given.

## 2014-12-16 NOTE — Patient Instructions (Signed)
First Trimester of Pregnancy The first trimester of pregnancy is from week 1 until the end of week 12 (months 1 through 3). During this time, your baby will begin to develop inside you. At 6-8 weeks, the eyes and face are formed, and the heartbeat can be seen on ultrasound. At the end of 12 weeks, all the baby's organs are formed. Prenatal care is all the medical care you receive before the birth of your baby. Make sure you get good prenatal care and follow all of your doctor's instructions. HOME CARE  Medicines  Take medicine only as told by your doctor. Some medicines are safe and some are not during pregnancy.  Take your prenatal vitamins as told by your doctor.  Take medicine that helps you poop (stool softener) as needed if your doctor says it is okay. Diet  Eat regular, healthy meals.  Your doctor will tell you the amount of weight gain that is right for you.  Avoid raw meat and uncooked cheese.  If you feel sick to your stomach (nauseous) or throw up (vomit):  Eat 4 or 5 small meals a day instead of 3 large meals.  Try eating a few soda crackers.  Drink liquids between meals instead of during meals.  If you have a hard time pooping (constipation):  Eat high-fiber foods like fresh vegetables, fruit, and whole grains.  Drink enough fluids to keep your pee (urine) clear or pale yellow. Activity and Exercise  Exercise only as told by your doctor. Stop exercising if you have cramps or pain in your lower belly (abdomen) or low back.  Try to avoid standing for long periods of time. Move your legs often if you must stand in one place for a long time.  Avoid heavy lifting.  Wear low-heeled shoes. Sit and stand up straight.  You can have sex unless your doctor tells you not to. Relief of Pain or Discomfort  Wear a good support bra if your breasts are sore.  Take warm water baths (sitz baths) to soothe pain or discomfort caused by hemorrhoids. Use hemorrhoid cream if your  doctor says it is okay.  Rest with your legs raised if you have leg cramps or low back pain.  Wear support hose if you have puffy, bulging veins (varicose veins) in your legs. Raise (elevate) your feet for 15 minutes, 3-4 times a day. Limit salt in your diet. Prenatal Care  Schedule your prenatal visits by the twelfth week of pregnancy.  Write down your questions. Take them to your prenatal visits.  Keep all your prenatal visits as told by your doctor. Safety  Wear your seat belt at all times when driving.  Make a list of emergency phone numbers. The list should include numbers for family, friends, the hospital, and police and fire departments. General Tips  Ask your doctor for a referral to a local prenatal class. Begin classes no later than at the start of month 6 of your pregnancy.  Ask for help if you need counseling or help with nutrition. Your doctor can give you advice or tell you where to go for help.  Do not use hot tubs, steam rooms, or saunas.  Do not douche or use tampons or scented sanitary pads.  Do not cross your legs for long periods of time.  Avoid litter boxes and soil used by cats.  Avoid all smoking, herbs, and alcohol. Avoid drugs not approved by your doctor.  Visit your dentist. At home, brush your teeth   with a soft toothbrush. Be gentle when you floss. GET HELP IF:  You are dizzy.  You have mild cramps or pressure in your lower belly.  You have a nagging pain in your belly area.  You continue to feel sick to your stomach, throw up, or have watery poop (diarrhea).  You have a bad smelling fluid coming from your vagina.  You have pain with peeing (urination).  You have increased puffiness (swelling) in your face, hands, legs, or ankles. GET HELP RIGHT AWAY IF:   You have a fever.  You are leaking fluid from your vagina.  You have spotting or bleeding from your vagina.  You have very bad belly cramping or pain.  You gain or lose weight  rapidly.  You throw up blood. It may look like coffee grounds.  You are around people who have German measles, fifth disease, or chickenpox.  You have a very bad headache.  You have shortness of breath.  You have any kind of trauma, such as from a fall or a car accident. Document Released: 11/30/2007 Document Revised: 10/28/2013 Document Reviewed: 04/23/2013 ExitCare Patient Information 2015 [employer]. This information is not intended to replace advice given to you by your health care provider. Make sure you discuss any questions you have with your health care provider.  

## 2014-12-31 ENCOUNTER — Ambulatory Visit (INDEPENDENT_AMBULATORY_CARE_PROVIDER_SITE_OTHER): Payer: BLUE CROSS/BLUE SHIELD | Admitting: Women's Health

## 2014-12-31 ENCOUNTER — Ambulatory Visit: Payer: BLUE CROSS/BLUE SHIELD | Admitting: Women's Health

## 2014-12-31 ENCOUNTER — Encounter: Payer: Self-pay | Admitting: Women's Health

## 2014-12-31 ENCOUNTER — Ambulatory Visit (INDEPENDENT_AMBULATORY_CARE_PROVIDER_SITE_OTHER): Payer: BLUE CROSS/BLUE SHIELD

## 2014-12-31 ENCOUNTER — Other Ambulatory Visit: Payer: Self-pay | Admitting: Women's Health

## 2014-12-31 ENCOUNTER — Other Ambulatory Visit: Payer: BLUE CROSS/BLUE SHIELD

## 2014-12-31 VITALS — BP 128/80 | Ht 64.0 in | Wt 174.0 lb

## 2014-12-31 DIAGNOSIS — O3680X0 Pregnancy with inconclusive fetal viability, not applicable or unspecified: Secondary | ICD-10-CM | POA: Diagnosis not present

## 2014-12-31 DIAGNOSIS — D251 Intramural leiomyoma of uterus: Secondary | ICD-10-CM

## 2014-12-31 NOTE — Progress Notes (Signed)
Patient ID: Karen Pollard, female   DOB: Nov 04, 1975, 39 y.o.   MRN: [id number]  HPI: Presents for follow-up ultrasound for questionable degenerating fibroid /fetal viability check  LMP May 3, normal cycle. Denies abdominal cramping, bleeding, spotting, discharge. Taking prenatal vitamins daily. First pregnancy   Exam: Appears well.  Abdomen-soft, nontender, obese. Ultrasound-T/V and T/A images. Anteverted fibroid uterus, subseptate versus arcuate uterus, living [place] IUP left fundus, size [redacted]W[redacted]D. FHM 155. Normal yolk sac 4.2 mm, right ovary 2 corpus luteum cysts, left ovary normal. Right fundal degenerating fibroid 13 x 88mm. Cervix long and closed.  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 Copy of ultrasound given, instructed to take copy and schedule new OB appointment. Continue taking prenatal vitamins/healthy diet. Safe pregnancy behaviors reviewed. Congratulations given.

## 2015-01-17 ENCOUNTER — Inpatient Hospital Stay (HOSPITAL_COMMUNITY)
Admission: AD | Admit: 2015-01-17 | Discharge: 2015-01-17 | Disposition: A | Discharge status: Home / Self Care | Payer: BLUE CROSS/BLUE SHIELD | Source: Ambulatory Visit | Attending: Obstetrics and Gynecology | Admitting: Obstetrics and Gynecology

## 2015-01-17 ENCOUNTER — Encounter (HOSPITAL_COMMUNITY): Payer: Self-pay | Admitting: *Deleted

## 2015-01-17 ENCOUNTER — Inpatient Hospital Stay (HOSPITAL_COMMUNITY): Payer: BLUE CROSS/BLUE SHIELD

## 2015-01-17 DIAGNOSIS — O034 Incomplete spontaneous abortion without complication: Secondary | ICD-10-CM | POA: Diagnosis not present

## 2015-01-17 DIAGNOSIS — R58 Hemorrhage, not elsewhere classified: Secondary | ICD-10-CM

## 2015-01-17 DIAGNOSIS — O209 Hemorrhage in early pregnancy, unspecified: Secondary | ICD-10-CM | POA: Diagnosis present

## 2015-01-17 LAB — WET PREP, GENITAL
Clue Cells Wet Prep HPF POC: NONE SEEN
Trich, Wet Prep: NONE SEEN
Yeast Wet Prep HPF POC: NONE SEEN

## 2015-01-17 LAB — CBC
HCT: 37.6 % (ref 36.0–46.0)
Hemoglobin: 13 g/dL (ref 12.0–15.0)
MCH: 28.4 pg (ref 26.0–34.0)
MCHC: 34.6 g/dL (ref 30.0–36.0)
MCV: 82.1 fL (ref 78.0–100.0)
Platelets: 179 10*3/uL (ref 150–400)
RBC: 4.58 MIL/uL (ref 3.87–5.11)
RDW: 20.5 % — ABNORMAL HIGH (ref 11.5–15.5)
WBC: 5.8 10*3/uL (ref 4.0–10.5)

## 2015-01-17 LAB — ABO/RH: ABO/RH(D): B POS

## 2015-01-17 LAB — HIV ANTIBODY (ROUTINE TESTING W REFLEX): HIV Screen 4th Generation wRfx: NONREACTIVE

## 2015-01-17 LAB — HCG, QUANTITATIVE, PREGNANCY: hCG, Beta Chain, Quant, S: 2433 m[IU]/mL — ABNORMAL HIGH (ref <5)

## 2015-01-17 MED ORDER — HYDROCODONE-ACETAMINOPHEN 5-325 MG PO TABS: 1.0000 | ORAL_TABLET | ORAL | Status: DC | PRN

## 2015-01-17 MED ORDER — IBUPROFEN 600 MG PO TABS: 600.0000 mg | ORAL_TABLET | Freq: Four times a day (QID) | ORAL | Status: DC | PRN

## 2015-01-17 MED ORDER — PROMETHAZINE HCL 12.5 MG PO TABS: 12.5000 mg | ORAL_TABLET | Freq: Four times a day (QID) | ORAL | Status: DC | PRN

## 2015-01-17 MED ORDER — MISOPROSTOL 200 MCG PO TABS: 800.0000 ug | ORAL_TABLET | Freq: Once | ORAL | Status: DC

## 2015-01-17 NOTE — MAU Note (Signed)
Some red blood on tissue when wiped. Noticed pink to red spotting all day Friday. Had intercourse Thurs. Night. No pain

## 2015-01-17 NOTE — MAU Provider Note (Signed)
History     CSN: [id number]  Arrival date and time: 01/17/15 0424   None     Chief Complaint  Patient presents with  . Vaginal Bleeding   Vaginal Bleeding The patient's primary symptoms include vaginal bleeding. Primary symptoms comment: spotting for 2 days. This is a new problem. The current episode started yesterday. The problem occurs daily. The problem has been gradually worsening. The patient is experiencing no pain. She is pregnant. Associated symptoms include hematuria. Pertinent negatives include no abdominal pain, back pain, chills, dysuria or fever. The vaginal discharge was bloody (spotting). The vaginal bleeding is lighter than menses. She has not been passing clots. She has not been passing tissue. The symptoms are aggravated by intercourse. She has tried nothing for the symptoms. She is sexually active. It is unknown whether or not her partner has an STD.    Karen Pollard 39 y.o. G1P0 @[redacted]w[redacted]d  presents to the MAU with the complaint of vaginal spotting for 2 days that got worse after having intercourse 1 day ago  Past Medical History  Diagnosis Date  . Diabetes mellitus 3-13    BORDERLINE  . Anemia 3-13    TOLD TO TAKE IRON PILLS    Past Surgical History  Procedure Laterality Date  . Inner ear surgery  AS A CHILD    DOES NOT REMEMBER WHICH EAR    Family History  Problem Relation Age of Onset  . Hypertension Father   . Diabetes Father     History  Substance Use Topics  . Smoking status: Never Smoker   . Smokeless tobacco: Never Used  . Alcohol Use: No     Comment: Rare    Allergies: No Known Allergies  Prescriptions prior to admission  Medication Sig Dispense Refill Last Dose  . Prenatal Vit-Fe Fumarate-FA (PRENATAL MULTIVITAMIN) TABS tablet Take 1 tablet by mouth daily at 12 noon.   01/16/2015 at Unknown time  . aspirin-acetaminophen-caffeine (EXCEDRIN MIGRAINE) 250-250-65 MG per tablet Take 2 tablets by mouth every 6 (six) hours as needed for pain.  (Patient not taking: Reported on 01/17/2015) 30 tablet 0 Not Taking at Unknown time    Review of Systems  Constitutional: Negative for fever and chills.  Gastrointestinal: Negative for abdominal pain.  Genitourinary: Positive for hematuria and vaginal bleeding. Negative for dysuria.       Vaginal bleeding  Musculoskeletal: Negative for back pain.  All other systems reviewed and are negative.  Physical Exam   Blood pressure 128/89, pulse 81, temperature 97.9 F (36.6 C), resp. rate 18, height 5\' 4"  (1.626 m), weight 180 lb 3.2 oz (81.738 kg), last menstrual period 10/28/2014.  Physical Exam  Nursing note and vitals reviewed. Constitutional: She is oriented to person, place, and time. She appears well-developed and well-nourished. No distress.  HENT:  Head: Normocephalic.  Cardiovascular: Normal rate.   Respiratory: Effort normal. No respiratory distress.  GI: Soft. There is no tenderness.  Genitourinary: Vagina normal.  Upon spec exam- 3 [place] swabs were needed to remove small to mod amount vaginal bleeding in [place]. No active bleeding could be seen from cervix. Cervix was closed on digital exam  Musculoskeletal: Normal range of motion.  Neurological: She is alert and oriented to person, place, and time.  Skin: Skin is warm and dry.  Psychiatric: She has a normal mood and affect. Her behavior is normal. Judgment and thought content normal.   US Ob Comp Less 14 Wks  01/17/2015   CLINICAL DATA:  Acute onset of  vaginal spotting.  Initial encounter.  EXAM: OBSTETRIC <14 WK [place] AND TRANSVAGINAL OB US  TECHNIQUE: Both transabdominal and transvaginal ultrasound examinations were performed for complete evaluation of the gestation as well as the maternal uterus, adnexal regions, and pelvic cul-de-sac. Transvaginal technique was performed to assess early pregnancy.  COMPARISON:  Pelvic ultrasound performed 12/31/2014  FINDINGS: Intrauterine gestational sac: Visualized/normal in shape.  Yolk sac:   Not well seen.  Embryo:  Yes  Cardiac Activity: No  Heart Rate: N/A  CRL:  2.21 cm   9 w   0 d                  [place] EDC: 08/22/2015  Maternal uterus/adnexae: A moderate amount of subchorionic hemorrhage is noted, measuring 4.3 x 1.5 x 3.7 cm.  The ovaries are unremarkable in appearance. The right ovary measures 3.1 x 1.2 x 2.4 cm, while the left ovary measures 4.3 x 1.5 x 2.3 cm. No suspicious adnexal masses are seen; there is no evidence for ovarian torsion.  IMPRESSION: 1. Single intrauterine pregnancy noted, with a crown-rump length of 2.2 cm, corresponding to a gestational age of [redacted] weeks 0 days. No associated fetal heartbeat seen, compatible with fetal demise. 2. Moderate amount of subchorionic hemorrhage noted.   Electronically Signed   By: Garald Balding M.D.   On: 01/17/2015 06:50   US Ob Transvaginal  01/17/2015   CLINICAL DATA:  Acute onset of vaginal spotting.  Initial encounter.  EXAM: OBSTETRIC <14 WK [place] AND TRANSVAGINAL OB US  TECHNIQUE: Both transabdominal and transvaginal ultrasound examinations were performed for complete evaluation of the gestation as well as the maternal uterus, adnexal regions, and pelvic cul-de-sac. Transvaginal technique was performed to assess early pregnancy.  COMPARISON:  Pelvic ultrasound performed 12/31/2014  FINDINGS: Intrauterine gestational sac: Visualized/normal in shape.  Yolk sac:  Not well seen.  Embryo:  Yes  Cardiac Activity: No  Heart Rate: N/A  CRL:  2.21 cm   9 w   0 d                  [place] EDC: 08/22/2015  Maternal uterus/adnexae: A moderate amount of subchorionic hemorrhage is noted, measuring 4.3 x 1.5 x 3.7 cm.  The ovaries are unremarkable in appearance. The right ovary measures 3.1 x 1.2 x 2.4 cm, while the left ovary measures 4.3 x 1.5 x 2.3 cm. No suspicious adnexal masses are seen; there is no evidence for ovarian torsion.  IMPRESSION: 1. Single intrauterine pregnancy noted, with a crown-rump length of 2.2 cm, corresponding to a gestational age of [redacted]  weeks 0 days. No associated fetal heartbeat seen, compatible with fetal demise. 2. Moderate amount of subchorionic hemorrhage noted.   Electronically Signed   By: Garald Balding M.D.   On: 01/17/2015 06:50   Results for orders placed or performed during the hospital encounter of 01/17/15 (from the past 24 hour(s))  Wet prep, genital     Status: Abnormal   Collection Time: 01/17/15  4:55 AM  Result Value Ref Range   Yeast Wet Prep HPF POC NONE SEEN NONE SEEN   Trich, Wet Prep NONE SEEN NONE SEEN   Clue Cells Wet Prep HPF POC NONE SEEN NONE SEEN   WBC, Wet Prep HPF POC RARE (A) NONE SEEN  hCG, quantitative, pregnancy     Status: Abnormal   Collection Time: 01/17/15  6:20 AM  Result Value Ref Range   hCG, Beta Chain, Quant, S 2433 (H) <5 mIU/mL  CBC     Status: Abnormal   Collection Time: 01/17/15  6:20 AM  Result Value Ref Range   WBC 5.8 4.0 - 10.5 K/uL   RBC 4.58 3.87 - 5.11 MIL/uL   Hemoglobin 13.0 12.0 - 15.0 g/dL   HCT 37.6 36.0 - 46.0 %   MCV 82.1 78.0 - 100.0 fL   MCH 28.4 26.0 - 34.0 pg   MCHC 34.6 30.0 - 36.0 g/dL   RDW 20.5 (H) 11.5 - 15.5 %   Platelets 179 150 - 400 K/uL  ABO/Rh     Status: None (Preliminary result)   Collection Time: 01/17/15  6:20 AM  Result Value Ref Range   ABO/RH(D) B POS    MAU Course  Procedures  MDM Spec exam reveals vaginal bleeding; Pending labs and u/s . Discussed POC with Dr. Glo Herring and passed off care to Butch Penny, NP  [phone]  Blood type ordered and awaiting results before client leaves.  Discussed cytotec and expected bleeding and cramping as the miscarriage is completed.  Will need an appointment in 2 weeks and discussed with client.  Has not yet seen a MD with this pregnancy but has an appointment scheduled on Thursday.  Does not know the name of the doctor.  Was referred from [facility] who she has seen previously.   She plans to call on Monday and see if the appointment can be changed to a 2 week follow up.  Will send  message to the clinic downstairs in case she is not able to get an appointment scheduled for a 2 week miscarriage followup.  Discussed letting the GYN clinic know when they call if she needs the appointment - she will only need to be seen at one office, not both her doctor (if scheduled) and the clinic here.      Early Intrauterine Pregnancy Failure  _X__  Documented intrauterine pregnancy failure less than or equal to [redacted] weeks gestation  _X__  No serious current illness  _X__  Baseline Hgb greater than or equal to 10g/dl  _X__  Patient has easily accessible transportation to the hospital  _X__  Clear preference  _X__  Practitioner/physician deems patient reliable  _X__  Counseling by practitioner or physician  _X__  Patient education by RN  _NA__  Rho-Gam given by RN if indicated  _X__ Medication Ordered   _X__   Cytotec 800 mcg - b[place]ally by patient at home  __   Intravaginally by patient at home         __   Intravaginally by RN in MAU        __   Rectally by patient at home        __   Rectally by RN in MAU  _X__  Ibuprofen 600 mg 1 tablet by mouth every 6 hours as needed #30  _X__  Hydrocodone/acetaminophen 5/325 mg by mouth every 4 to 6 hours as needed  _X__  Phenergan 12.5 mg by mouth every 4 hours as needed for nausea    Assessment and Plan  Incomplete Abortion  Plan Medications ordered and client to get at her pharmacy Have an appointment in 2 weeks - note to clinic - see info above Return if having severe bleeding and is feeling faint. Note for work given for client to be out of work on Saturday and Sunday.   BURLESON,TERRI 01/17/2015, 8:50 AM

## 2015-01-17 NOTE — Discharge Instructions (Signed)
Be seen by your doctor in 2 weeks. Expect to have heavy bleeding and lots of cramping.  Get the pain medicine from the pharmacy and take as needed. Return if you have such heavy bleeding that you are feeling faint. Do not try to become pregnant for 3 months.  Discuss this with the doctor that you see in 2 weeks.

## 2015-01-19 LAB — GC/CHLAMYDIA PROBE AMP (~~LOC~~) NOT AT ARMC
Chlamydia: NEGATIVE
Neisseria Gonorrhea: NEGATIVE

## 2015-01-20 ENCOUNTER — Ambulatory Visit (INDEPENDENT_AMBULATORY_CARE_PROVIDER_SITE_OTHER): Payer: BLUE CROSS/BLUE SHIELD | Admitting: Women's Health

## 2015-01-20 ENCOUNTER — Encounter: Payer: Self-pay | Admitting: Women's Health

## 2015-01-20 VITALS — BP 120/80 | Ht 64.0 in | Wt 178.0 lb

## 2015-01-20 DIAGNOSIS — O021 Missed abortion: Secondary | ICD-10-CM | POA: Diagnosis not present

## 2015-01-20 DIAGNOSIS — O039 Complete or unspecified spontaneous abortion without complication: Secondary | ICD-10-CM | POA: Diagnosis not present

## 2015-01-20 NOTE — Patient Instructions (Signed)
Miscarriage A miscarriage is the sudden loss of an unborn baby (fetus) before the 20th week of pregnancy. Most miscarriages happen in the first 3 months of pregnancy. Sometimes, it happens before a woman even knows she is pregnant. A miscarriage is also called a "spontaneous miscarriage" or "early pregnancy loss." Having a miscarriage can be an emotional experience. Talk with your caregiver about any questions you may have about miscarrying, the grieving process, and your future pregnancy plans. CAUSES   Problems with the fetal chromosomes that make it impossible for the baby to develop normally. Problems with the baby's genes or chromosomes are most often the result of errors that occur, by chance, as the embryo divides and grows. The problems are not inherited from the parents.  Infection of the cervix or uterus.   Hormone problems.   Problems with the cervix, such as having an incompetent cervix. This is when the tissue in the cervix is not strong enough to hold the pregnancy.   Problems with the uterus, such as an abnormally shaped uterus, uterine fibroids, or congenital abnormalities.   Certain medical conditions.   Smoking, drinking alcohol, or taking illegal drugs.   Trauma.  Often, the cause of a miscarriage is unknown.  SYMPTOMS   Vaginal bleeding or spotting, with or without cramps or pain.  Pain or cramping in the abdomen or lower back.  Passing fluid, tissue, or blood clots from the vagina. DIAGNOSIS  Your caregiver will perform a physical exam. You may also have an ultrasound to confirm the miscarriage. Blood or urine tests may also be ordered. TREATMENT   Sometimes, treatment is not necessary if you naturally pass all the fetal tissue that was in the uterus. If some of the fetus or placenta remains in the body (incomplete miscarriage), tissue left behind may become infected and must be removed. Usually, a dilation and curettage (D and C) procedure is performed.  During a D and C procedure, the cervix is widened (dilated) and any remaining fetal or placental tissue is gently removed from the uterus.  Antibiotic medicines are prescribed if there is an infection. Other medicines may be given to reduce the size of the uterus (contract) if there is a lot of bleeding.  If you have Rh negative blood and your baby was Rh positive, you will need a Rh immunoglobulin shot. This shot will protect any future baby from having Rh blood problems in future pregnancies. HOME CARE INSTRUCTIONS   Your caregiver may order bed rest or may allow you to continue light activity. Resume activity as directed by your caregiver.  Have someone help with home and family responsibilities during this time.   Keep track of the number of sanitary pads you use each day and how soaked (saturated) they are. Write down this information.   Do not use tampons. Do not douche or have sexual intercourse until approved by your caregiver.   Only take over-the-counter or prescription medicines for pain or discomfort as directed by your caregiver.   Do not take aspirin. Aspirin can cause bleeding.   Keep all follow-up appointments with your caregiver.   If you or your partner have problems with grieving, talk to your caregiver or seek counseling to help cope with the pregnancy loss. Allow enough time to grieve before trying to get pregnant again.  SEEK IMMEDIATE MEDICAL CARE IF:   You have severe cramps or pain in your back or abdomen.  You have a fever.  You pass large blood clots (walnut-sized   or larger) ortissue from your vagina. Save any tissue for your caregiver to inspect.   Your bleeding increases.   You have a thick, bad-smelling vaginal discharge.  You become lightheaded, weak, or you faint.   You have chills.  MAKE SURE YOU:  Understand these instructions.  Will watch your condition.  Will get help right away if you are not doing well or get  worse. Document Released: 12/07/2000 Document Revised: 10/08/2012 Document Reviewed: 08/02/2011 [employer] Patient Information 2015 [employer]. This information is not intended to replace advice given to you by your health care provider. Make sure you discuss any questions you have with your health care provider.  

## 2015-01-20 NOTE — Progress Notes (Signed)
Patient ID: Karen Pollard, female   DOB: 1976-04-06, 39 y.o.   MRN: [id number] Presents for follow-up pregnancy. Was seen 01/17/2015 at [facility] for spotting was found to have a missed AB. Per dates should be 11 weeks, [place] 9 weeks size with no fetal heart tones on ultrasound. Was seen here at the office with positive fetal heart tones in June. B+ blood type. Was given Cytotec on 01/17/2015, passed large clots, heavy bleeding with cramps rated at an 8-9 on 7/24. Bleeding has persisted but is much lighter, rates pain now at 3.   Exam: Appears well. Accompanied by mother. Abdomen soft nontender no rebound. External genitalia within normal limits, speculum exam cervix was closed, moderate amount of menses type dark blood noted. No CMT or adnexal tenderness.  SAB after Cytotec  Plan: Ultrasound to confirm completion of SAB. Will schedule. Continue prenatal vitamins daily. Condolences given. Reviewed best to use contraception for 2-3 months and then began trying again.

## 2015-01-21 ENCOUNTER — Ambulatory Visit (INDEPENDENT_AMBULATORY_CARE_PROVIDER_SITE_OTHER): Payer: BLUE CROSS/BLUE SHIELD

## 2015-01-21 ENCOUNTER — Encounter: Payer: Self-pay | Admitting: Women's Health

## 2015-01-21 ENCOUNTER — Ambulatory Visit (INDEPENDENT_AMBULATORY_CARE_PROVIDER_SITE_OTHER): Payer: BLUE CROSS/BLUE SHIELD | Admitting: Women's Health

## 2015-01-21 ENCOUNTER — Telehealth: Payer: Self-pay | Admitting: General Practice

## 2015-01-21 VITALS — BP 124/80 | Ht 64.0 in | Wt 178.0 lb

## 2015-01-21 DIAGNOSIS — O039 Complete or unspecified spontaneous abortion without complication: Secondary | ICD-10-CM

## 2015-01-21 DIAGNOSIS — O021 Missed abortion: Secondary | ICD-10-CM | POA: Diagnosis not present

## 2015-01-21 NOTE — Telephone Encounter (Signed)
Opened in error

## 2015-01-21 NOTE — Progress Notes (Signed)
Patient ID: Karen Pollard, female   DOB: 03-08-1976, 39 y.o.   MRN: [id number] Presents for follow-up ultrasound for missed AB. 01/16/2015 began spotting was seen at [facility] and was found to have a missed AB, dates 11 weeks size of fetus  9 weeks with no FHT, was given Cytotec. Had heavy bleeding with large clots for 2 days cramping rated at 8 now at a 2-3. B+ blood type. States passed another large clot with cramps immediately after  today's ultrasound. No cramping at this time.  Exam: Appears well. Ultrasound: T/V anteverted uterus with no evidence of fetal pole, fetal heart motion, gestational sac. No RPOC seen. Prominent endometrium from fundus 24 mm to cervix 18 mm. Avascular fundus seen. Right and left ovary normal. Previous corpus luteal cyst not seen. Negative cul-de-sac. No change in fibroids.  Missed AB - SAB complete after Cytotec  Plan: Instructed to call if bleeding persists after 1 week, cycle should resume monthly, condoms first 2 months, continue prenatal vitamins. Condolences given.

## 2015-08-11 ENCOUNTER — Encounter: Payer: BLUE CROSS/BLUE SHIELD | Admitting: Women's Health

## 2015-08-26 ENCOUNTER — Ambulatory Visit (INDEPENDENT_AMBULATORY_CARE_PROVIDER_SITE_OTHER): Payer: BLUE CROSS/BLUE SHIELD | Admitting: Women's Health

## 2015-08-26 ENCOUNTER — Encounter: Payer: BLUE CROSS/BLUE SHIELD | Admitting: Women's Health

## 2015-08-26 ENCOUNTER — Encounter: Payer: Self-pay | Admitting: Women's Health

## 2015-08-26 VITALS — BP 132/90 | Ht 64.0 in | Wt 191.0 lb

## 2015-08-26 DIAGNOSIS — D259 Leiomyoma of uterus, unspecified: Secondary | ICD-10-CM | POA: Diagnosis not present

## 2015-08-26 DIAGNOSIS — Z13220 Encounter for screening for lipoid disorders: Secondary | ICD-10-CM

## 2015-08-26 DIAGNOSIS — Z01419 Encounter for gynecological examination (general) (routine) without abnormal findings: Secondary | ICD-10-CM | POA: Diagnosis not present

## 2015-08-26 DIAGNOSIS — Z833 Family history of diabetes mellitus: Secondary | ICD-10-CM

## 2015-08-26 DIAGNOSIS — N946 Dysmenorrhea, unspecified: Secondary | ICD-10-CM | POA: Diagnosis not present

## 2015-08-26 LAB — CBC WITH DIFFERENTIAL/PLATELET
Basophils Absolute: 0.1 10*3/uL (ref 0.0–0.1)
Basophils Relative: 1 % (ref 0–1)
EOS ABS: 0.5 10*3/uL (ref 0.0–0.7)
EOS PCT: 6 % — AB (ref 0–5)
HEMATOCRIT: 37.3 % (ref 36.0–46.0)
Hemoglobin: 12.2 g/dL (ref 12.0–15.0)
LYMPHS ABS: 2.3 10*3/uL (ref 0.7–4.0)
LYMPHS PCT: 30 % (ref 12–46)
MCH: 27.5 pg (ref 26.0–34.0)
MCHC: 32.7 g/dL (ref 30.0–36.0)
MCV: 84.2 fL (ref 78.0–100.0)
MONO ABS: 0.5 10*3/uL (ref 0.1–1.0)
MONOS PCT: 7 % (ref 3–12)
MPV: 11.5 fL (ref 8.6–12.4)
Neutro Abs: 4.2 10*3/uL (ref 1.7–7.7)
Neutrophils Relative %: 56 % (ref 43–77)
PLATELETS: 228 10*3/uL (ref 150–400)
RBC: 4.43 MIL/uL (ref 3.87–5.11)
RDW: 15.1 % (ref 11.5–15.5)
WBC: 7.5 10*3/uL (ref 4.0–10.5)

## 2015-08-26 LAB — HEMOGLOBIN A1C
HEMOGLOBIN A1C: 5.6 % (ref <5.7)
MEAN PLASMA GLUCOSE: 114 mg/dL (ref <117)

## 2015-08-26 MED ORDER — IBUPROFEN 600 MG PO TABS: 600.0000 mg | ORAL_TABLET | Freq: Four times a day (QID) | ORAL | Status: DC | PRN

## 2015-08-26 NOTE — Progress Notes (Signed)
Karen Pollard 38-Nov-1977 [id number]    History:    Presents for annual exam.  Monthly 5 day cycle 3 days heavy flow. 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 12/2014 SAB. Desires pregnancy. Normal Pap history. Blood pressure 132/90 today has had some elevated pressures in the past.  Past medical history, past surgical history, family history and social history were all reviewed and documented in the EPIC chart. 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  ROS:  A ROS was performed and pertinent positives and negatives are included.  Exam:  Filed Vitals:   08/26/15 1503  BP: 132/90    General appearance:  Normal Thyroid:  Symmetrical, normal in size, without palpable masses or nodularity. Respiratory  Auscultation:  Clear without wheezing or rhonchi Cardiovascular  Auscultation:  Regular rate, without rubs, murmurs or gallops  Edema/varicosities:  Not grossly evident Abdominal  Soft,nontender, without masses, guarding or rebound.  Liver/spleen:  No organomegaly noted  Hernia:  None appreciated  Skin  Inspection:  Grossly normal   Breasts: Examined lying and sitting.     Right: Without masses, retractions, discharge or axillary adenopathy.     Left: Without masses, retractions, discharge or axillary adenopathy. Gentitourinary   Inguinal/mons:  Normal without inguinal adenopathy  External genitalia:  Normal  BUS/Urethra/Skene's glands:  Normal  Vagina:  Normal  Cervix:  Normal  Uterus:  normal in size, shape and contour.  Midline and mobile  Adnexa/parametria:     Rt: Without masses or tenderness.   Lt: Without masses or tenderness.  Anus and perineum: Normal  Digital rectal exam: Normal sphincter tone without palpated masses or tenderness  Assessment/Plan:  40 y.o. MBF G1 P0  for annual exam desiring pregnancy.   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 Phineas Real after next cycle. SBE's, increase regular exercise, calcium rich diet, prenatal vitamin daily. Encouraged to decrease carbohydrates and fried foods in diet for weight loss. Reviewed importance of increasing frequency of intercourse, healthy pregnancy behaviors reviewed. Return to office after missed cycle for viability ultrasound. Aware we no longer deliver. Rubella vaccine discussed, rubella equivocal, will talk with school nurse (works in [employer]) to find out if any outbreaks recent, reviewed if vaccine given needs to wait  3 months to conceive. Pressures CBC, lipid panel, CMP, hemoglobin A1c, UA, Pap normal 2016, new screening guidelines reviewed.   Huel Cote [facility], 3:36 PM 08/26/2015

## 2015-08-26 NOTE — Patient Instructions (Signed)
Health Maintenance, Female Adopting a healthy lifestyle and getting preventive care can go a long way to promote health and wellness. Talk with your health care provider about what schedule of regular examinations is right for you. This is a good chance for you to check in with your provider about disease prevention and staying healthy. In between checkups, there are plenty of things you can do on your own. Experts have done a lot of research about which lifestyle changes and preventive measures are most likely to keep you healthy. Ask your health care provider for more information. WEIGHT AND DIET  Eat a healthy diet  Be sure to include plenty of vegetables, fruits, low-fat dairy products, and lean protein.  Do not eat a lot of foods high in solid fats, added sugars, or salt.  Get regular exercise. This is one of the most important things you can do for your health.  Most adults should exercise for at least 150 minutes each week. The exercise should increase your heart rate and make you sweat (moderate-intensity exercise).  Most adults should also do strengthening exercises at least twice a week. This is in addition to the moderate-intensity exercise.  Maintain a healthy weight  Body mass index (BMI) is a measurement that can be used to identify possible weight problems. It estimates body fat based on height and weight. Your health care provider can help determine your BMI and help you achieve or maintain a healthy weight.  For females 20 years of age and older:   A BMI below 18.5 is considered underweight.  A BMI of 18.5 to 24.9 is normal.  A BMI of 25 to 29.9 is considered overweight.  A BMI of 30 and above is considered obese.  Watch levels of cholesterol and blood lipids  You should start having your blood tested for lipids and cholesterol at 38 years of age, then have this test every 5 years.  You may need to have your cholesterol levels checked more often if:  Your lipid  or cholesterol levels are high.  You are older than 38 years of age.  You are at high risk for heart disease.  CANCER SCREENING   Lung Cancer  Lung cancer screening is recommended for adults 55-80 years old who are at high risk for lung cancer because of a history of smoking.  A yearly low-dose CT scan of the lungs is recommended for people who:  Currently smoke.  Have quit within the past 15 years.  Have at least a 30-pack-year history of smoking. A pack year is smoking an average of one pack of cigarettes a day for 1 year.  Yearly screening should continue until it has been 15 years since you quit.  Yearly screening should stop if you develop a health problem that would prevent you from having lung cancer treatment.  Breast Cancer  Practice breast self-awareness. This means understanding how your breasts normally appear and feel.  It also means doing regular breast self-exams. Let your health care provider know about any changes, no matter how small.  If you are in your 20s or 30s, you should have a clinical breast exam (CBE) by a health care provider every 1-3 years as part of a regular health exam.  If you are 40 or older, have a CBE every year. Also consider having a breast X-ray (mammogram) every year.  If you have a family history of breast cancer, talk to your health care provider about genetic screening.  If you   are at high risk for breast cancer, talk to your health care provider about having an MRI and a mammogram every year.  Breast cancer gene (BRCA) assessment is recommended for women who have family members with BRCA-related cancers. BRCA-related cancers include:  Breast.  Ovarian.  Tubal.  Peritoneal cancers.  Results of the assessment will determine the need for genetic counseling and BRCA1 and BRCA2 testing. Cervical Cancer Your health care provider may recommend that you be screened regularly for cancer of the pelvic organs (ovaries, uterus, and  vagina). This screening involves a pelvic examination, including checking for microscopic changes to the surface of your cervix (Pap test). You may be encouraged to have this screening done every 3 years, beginning at age 21.  For women ages 30-65, health care providers may recommend pelvic exams and Pap testing every 3 years, or they may recommend the Pap and pelvic exam, combined with testing for human papilloma virus (HPV), every 5 years. Some types of HPV increase your risk of cervical cancer. Testing for HPV may also be done on women of any age with unclear Pap test results.  Other health care providers may not recommend any screening for nonpregnant women who are considered low risk for pelvic cancer and who do not have symptoms. Ask your health care provider if a screening pelvic exam is right for you.  If you have had past treatment for cervical cancer or a condition that could lead to cancer, you need Pap tests and screening for cancer for at least 20 years after your treatment. If Pap tests have been discontinued, your risk factors (such as having a new sexual partner) need to be reassessed to determine if screening should resume. Some women have medical problems that increase the chance of getting cervical cancer. In these cases, your health care provider may recommend more frequent screening and Pap tests. Colorectal Cancer  This type of cancer can be detected and often prevented.  Routine colorectal cancer screening usually begins at 38 years of age and continues through 38 years of age.  Your health care provider may recommend screening at an earlier age if you have risk factors for colon cancer.  Your health care provider may also recommend using home test kits to check for hidden blood in the stool.  A small camera at the end of a tube can be used to examine your colon directly (sigmoidoscopy or colonoscopy). This is done to check for the earliest forms of colorectal  cancer.  Routine screening usually begins at age 50.  Direct examination of the colon should be repeated every 5-10 years through 38 years of age. However, you may need to be screened more often if early forms of precancerous polyps or small growths are found. Skin Cancer  Check your skin from head to toe regularly.  Tell your health care provider about any new moles or changes in moles, especially if there is a change in a mole's shape or color.  Also tell your health care provider if you have a mole that is larger than the size of a pencil eraser.  Always use sunscreen. Apply sunscreen liberally and repeatedly throughout the day.  Protect yourself by wearing long sleeves, pants, a wide-brimmed hat, and sunglasses whenever you are outside. HEART DISEASE, DIABETES, AND HIGH BLOOD PRESSURE   High blood pressure causes heart disease and increases the risk of stroke. High blood pressure is more likely to develop in:  People who have blood pressure in the high end   of the normal range (130-139/85-89 mm Hg).  People who are overweight or obese.  People who are African American.  If you are 38-23 years of age, have your blood pressure checked every 3-5 years. If you are 61 years of age or older, have your blood pressure checked every year. You should have your blood pressure measured twice--once when you are at a hospital or clinic, and once when you are not at a hospital or clinic. Record the average of the two measurements. To check your blood pressure when you are not at a hospital or clinic, you can use:  An automated blood pressure machine at a pharmacy.  A home blood pressure monitor.  If you are between 45 years and 39 years old, ask your health care provider if you should take aspirin to prevent strokes.  Have regular diabetes screenings. This involves taking a blood sample to check your fasting blood sugar level.  If you are at a normal weight and have a low risk for diabetes,  have this test once every three years after 38 years of age.  If you are overweight and have a high risk for diabetes, consider being tested at a younger age or more often. PREVENTING INFECTION  Hepatitis B  If you have a higher risk for hepatitis B, you should be screened for this virus. You are considered at high risk for hepatitis B if:  You were born in a country where hepatitis B is common. Ask your health care provider which countries are considered high risk.  Your parents were born in a high-risk country, and you have not been immunized against hepatitis B (hepatitis B vaccine).  You have HIV or AIDS.  You use needles to inject street drugs.  You live with someone who has hepatitis B.  You have had sex with someone who has hepatitis B.  You get hemodialysis treatment.  You take certain medicines for conditions, including cancer, organ transplantation, and autoimmune conditions. Hepatitis C  Blood testing is recommended for:  Everyone born from [birth year] through 1963.  Anyone with known risk factors for hepatitis C. Sexually transmitted infections (STIs)  You should be screened for sexually transmitted infections (STIs) including gonorrhea and chlamydia if:  You are sexually active and are younger than 38 years of age.  You are older than 38 years of age and your health care provider tells you that you are at risk for this type of infection.  Your sexual activity has changed since you were last screened and you are at an increased risk for chlamydia or gonorrhea. Ask your health care provider if you are at risk.  If you do not have HIV, but are at risk, it may be recommended that you take a prescription medicine daily to prevent HIV infection. This is called pre-exposure prophylaxis (PrEP). You are considered at risk if:  You are sexually active and do not regularly use condoms or know the HIV status of your partner(s).  You take drugs by injection.  You are sexually  active with a partner who has HIV. Talk with your health care provider about whether you are at high risk of being infected with HIV. If you choose to begin PrEP, you should first be tested for HIV. You should then be tested every 3 months for as long as you are taking PrEP.  PREGNANCY   If you are premenopausal and you may become pregnant, ask your health care provider about preconception counseling.  If you may  become pregnant, take 400 to 800 micrograms (mcg) of folic acid every day.  If you want to prevent pregnancy, talk to your health care provider about birth control (contraception). OSTEOPOROSIS AND MENOPAUSE   Osteoporosis is a disease in which the bones lose minerals and strength with aging. This can result in serious bone fractures. Your risk for osteoporosis can be identified using a bone density scan.  If you are 33 years of age or older, or if you are at risk for osteoporosis and fractures, ask your health care provider if you should be screened.  Ask your health care provider whether you should take a calcium or vitamin D supplement to lower your risk for osteoporosis.  Menopause may have certain physical symptoms and risks.  Hormone replacement therapy may reduce some of these symptoms and risks. Talk to your health care provider about whether hormone replacement therapy is right for you.  HOME CARE INSTRUCTIONS   Schedule regular health, dental, and eye exams.  Stay current with your immunizations.   Do not use any tobacco products including cigarettes, chewing tobacco, or electronic cigarettes.  If you are pregnant, do not drink alcohol.  If you are breastfeeding, limit how much and how often you drink alcohol.  Limit alcohol intake to no more than 1 drink per day for nonpregnant women. One drink equals 12 ounces of beer, 5 ounces of wine, or 1 ounces of hard liquor.  Do not use street drugs.  Do not share needles.  Ask your health care provider for help if  you need support or information about quitting drugs.  Tell your health care provider if you often feel depressed.  Tell your health care provider if you have ever been abused or do not feel safe at home.   This information is not intended to replace advice given to you by your health care provider. Make sure you discuss any questions you have with your health care provider.   Document Released: 12/27/2010 Document Revised: 07/04/2014 Document Reviewed: 05/15/2013 Elsevier Interactive Patient Education 2016 [employer] Uterine Fibroids Uterine fibroids are tissue masses (tumors) that can develop in the womb (uterus). They are also called leiomyomas. This type of tumor is not cancerous (benign) and does not spread to other parts of the body outside of the pelvic area, which is between the hip bones. Occasionally, fibroids may develop in the fallopian tubes, in the cervix, or on the support structures (ligaments) that surround the uterus. You can have one or many fibroids. Fibroids can vary in size, weight, and where they grow in the uterus. Some can become quite large. Most fibroids do not require medical treatment. CAUSES A fibroid can develop when a single uterine cell keeps growing (replicating). Most cells in the human body have a control mechanism that keeps them from replicating without control. SIGNS AND SYMPTOMS Symptoms may include:   Heavy bleeding during your period.  Bleeding or spotting between periods.  Pelvic pain and pressure.  Bladder problems, such as needing to urinate more often (urinary frequency) or urgently.  Inability to reproduce offspring (infertility).  Miscarriages. DIAGNOSIS Uterine fibroids are diagnosed through a physical exam. Your health care provider may feel the lumpy tumors during a pelvic exam. Ultrasonography and an MRI may be done to determine the size, location, and number of fibroids. TREATMENT Treatment may include:  Watchful waiting. This  involves getting the fibroid checked by your health care provider to see if it grows or shrinks. Follow your health care provider's  recommendations for how often to have this checked.  Hormone medicines. These can be taken by mouth or given through an intrauterine device (IUD).  Surgery.  Removing the fibroids (myomectomy) or the uterus (hysterectomy).  Removing blood supply to the fibroids (uterine artery embolization). If fibroids interfere with your fertility and you want to become pregnant, your health care provider may recommend having the fibroids removed.  HOME CARE INSTRUCTIONS  Keep all follow-up visits as directed by your health care provider. This is important.  Take medicines only as directed by your health care provider.  If you were prescribed a hormone treatment, take the hormone medicines exactly as directed.  Do not take aspirin, because it can cause bleeding.  Ask your health care provider about taking iron pills and increasing the amount of dark green, leafy vegetables in your diet. These actions can help to boost your blood iron levels, which may be affected by heavy menstrual bleeding.  Pay close attention to your period and tell your health care provider about any changes, such as:  Increased blood flow that requires you to use more pads or tampons than usual per month.  A change in the number of days that your period lasts per month.  A change in symptoms that are associated with your period, such as abdominal cramping or back pain. SEEK MEDICAL CARE IF:  You have pelvic pain, back pain, or abdominal cramps that cannot be controlled with medicines.  You have an increase in bleeding between and during periods.  You soak tampons or pads in a half hour or less.  You feel lightheaded, extra tired, or weak. SEEK IMMEDIATE MEDICAL CARE IF:  You faint.  You have a sudden increase in pelvic pain.   This information is not intended to replace advice given to  you by your health care provider. Make sure you discuss any questions you have with your health care provider.   Document Released: 06/10/2000 Document Revised: 07/04/2014 Document Reviewed: 12/10/2013 Elsevier Interactive Patient Education [employer].

## 2015-08-27 LAB — URINALYSIS W MICROSCOPIC + REFLEX CULTURE
BACTERIA UA: NONE SEEN [HPF]
Bilirubin Urine: NEGATIVE
CRYSTALS: NONE SEEN [HPF]
Casts: NONE SEEN [LPF]
GLUCOSE, UA: NEGATIVE
HGB URINE DIPSTICK: NEGATIVE
Ketones, ur: NEGATIVE
LEUKOCYTES UA: NEGATIVE
Nitrite: NEGATIVE
PROTEIN: NEGATIVE
RBC / HPF: NONE SEEN RBC/HPF (ref <=2)
Specific Gravity, Urine: 1.032 (ref 1.001–1.035)
WBC, UA: NONE SEEN WBC/HPF (ref <=5)
YEAST: NONE SEEN [HPF]
pH: 6 (ref 5.0–8.0)

## 2015-08-27 LAB — COMPREHENSIVE METABOLIC PANEL
ALT: 22 U/L (ref 6–29)
AST: 20 U/L (ref 10–30)
Albumin: 4.1 g/dL (ref 3.6–5.1)
Alkaline Phosphatase: 63 U/L (ref 33–115)
BUN: 19 mg/dL (ref 7–25)
CALCIUM: 9.3 mg/dL (ref 8.6–10.2)
CHLORIDE: 103 mmol/L (ref 98–110)
CO2: 24 mmol/L (ref 20–31)
Creat: 0.67 mg/dL (ref 0.50–1.10)
GLUCOSE: 75 mg/dL (ref 65–99)
POTASSIUM: 4.2 mmol/L (ref 3.5–5.3)
Sodium: 138 mmol/L (ref 135–146)
Total Bilirubin: 0.4 mg/dL (ref 0.2–1.2)
Total Protein: 7.1 g/dL (ref 6.1–8.1)

## 2015-08-27 LAB — LIPID PANEL
Cholesterol: 200 mg/dL (ref 125–200)
HDL: 52 mg/dL (ref >=46)
LDL Cholesterol: 131 mg/dL — ABNORMAL HIGH (ref <130)
Total CHOL/HDL Ratio: 3.8 Ratio (ref <=5.0)
Triglycerides: 86 mg/dL (ref <150)
VLDL: 17 mg/dL (ref <30)

## 2015-09-10 ENCOUNTER — Telehealth: Payer: Self-pay

## 2015-09-10 NOTE — Telephone Encounter (Signed)
Patient called and she was informed.

## 2015-09-10 NOTE — Telephone Encounter (Signed)
I left message for patient to call me as email I sent her through My Chart regarding her results was returned unread.  "Elon Alas, NP has reviewed your lab results.  Reviewed labs. All looked good. Blood sugar normal, hemoglobin A1c in the normal range. Continue to watch and avoid simple sugars and fried foods in diet. LDL ("Bad cholesterol") is a little elevated, regular exercise will help, less than 20 g saturated fat daily and take a fish oil supplement daily.along with the prenatal vitamin".

## 2016-07-31 IMAGING — US US OB COMP LESS 14 WK
1 series · 13 of 28 positions shown · non-contrast
Comparison: Pelvic ultrasound performed 12/31/2014

CLINICAL DATA: Acute onset of vaginal spotting.  Initial encounter.

EXAM:
OBSTETRIC <14 WK US AND TRANSVAGINAL OB US
TECHNIQUE: Both transabdominal and transvaginal ultrasound examinations were
performed for complete evaluation of the gestation as well as the
maternal uterus, adnexal regions, and pelvic cul-de-sac.
Transvaginal technique was performed to assess early pregnancy.

[Series 1: us ob comp less 14 wk · 64 acquisitions, 13 frames shown]
[im 3/64]
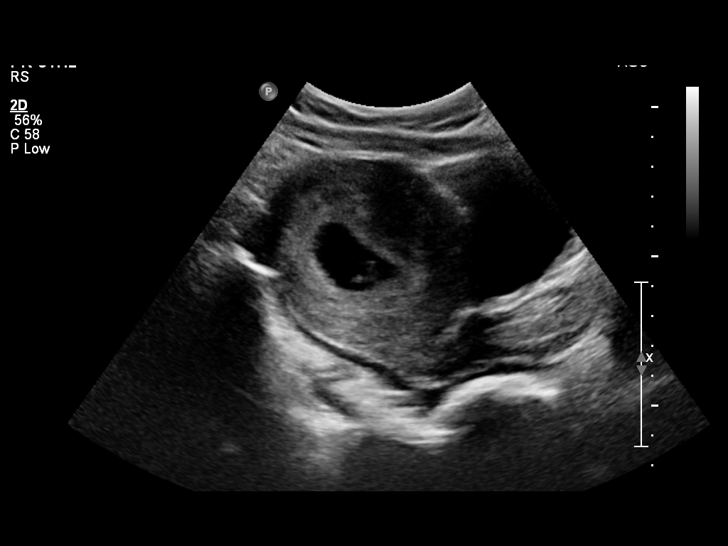
[im 8/64]
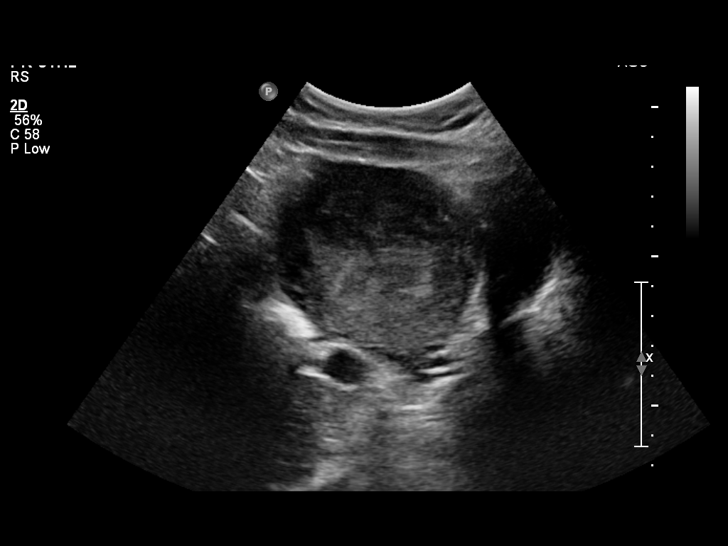
[im 12/64]
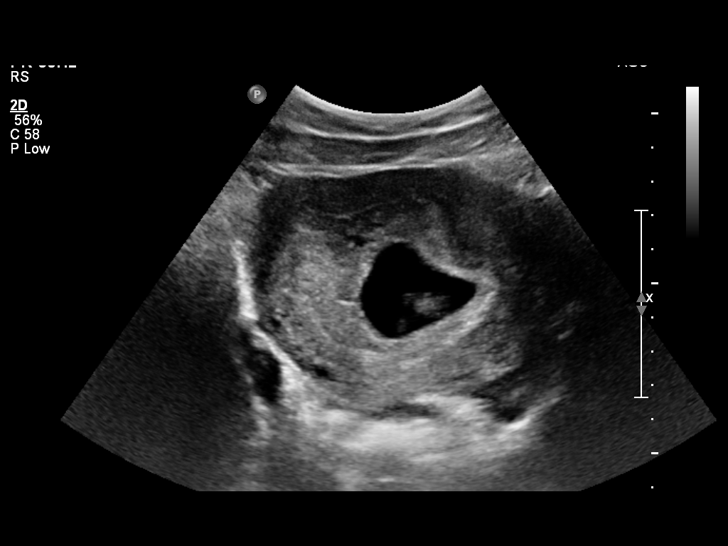
[im 17/64]
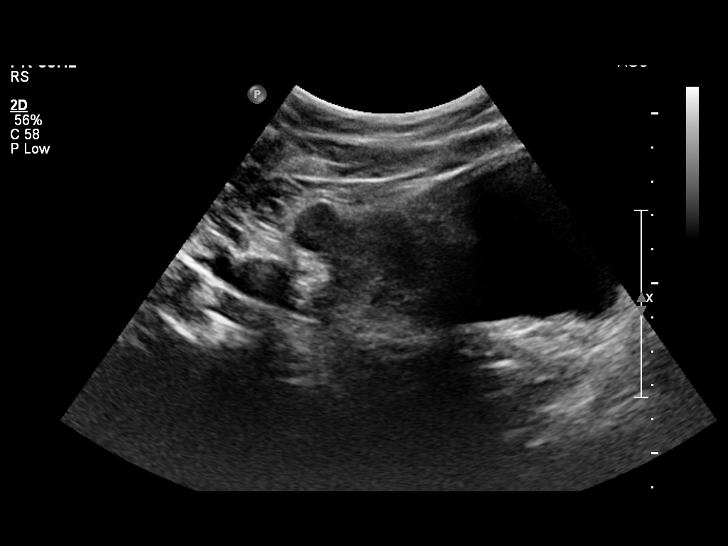
[im 22/64]
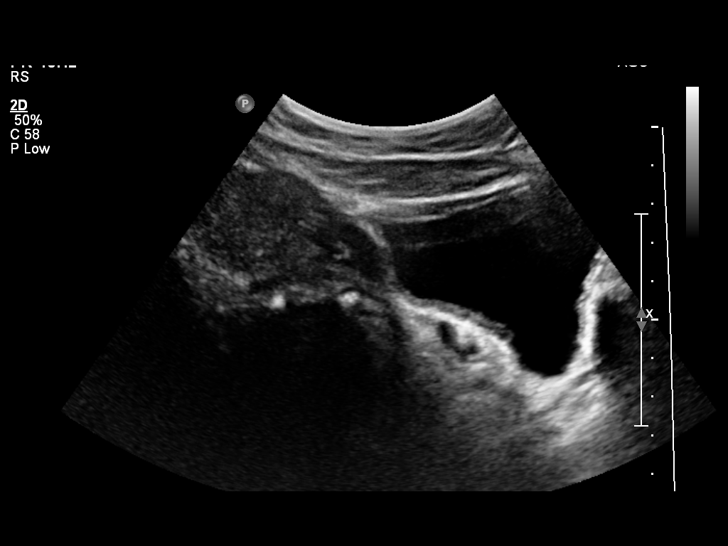
[im 26/64]
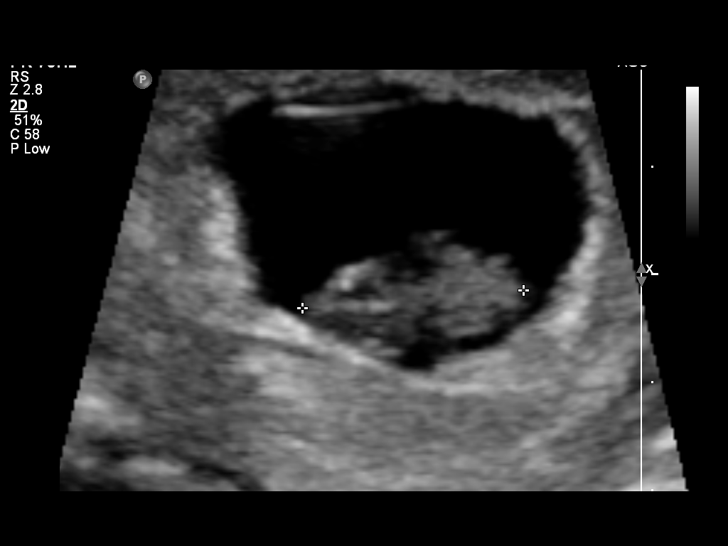
[im 33/64]
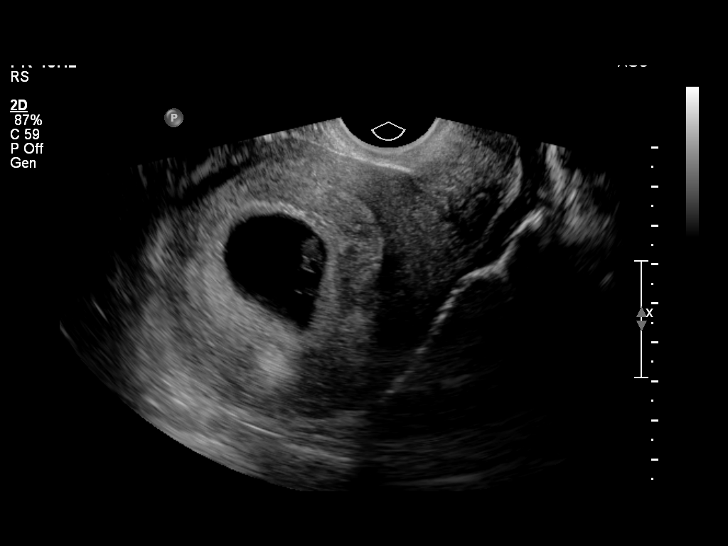
[im 38/64]
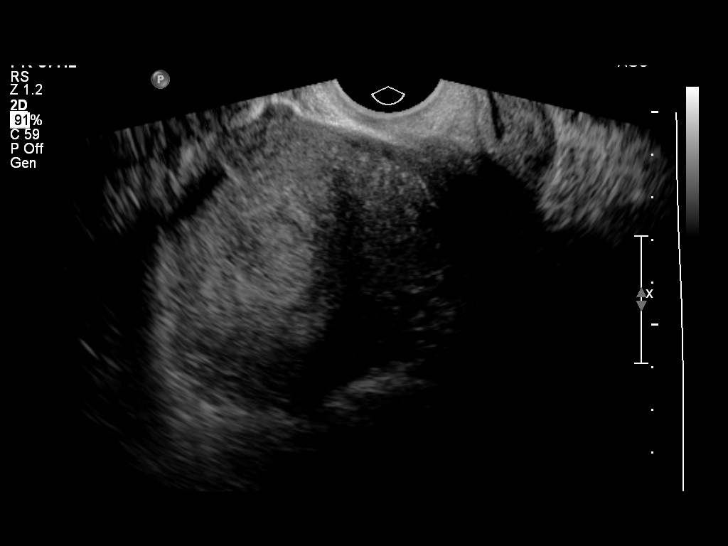
[im 43/64]
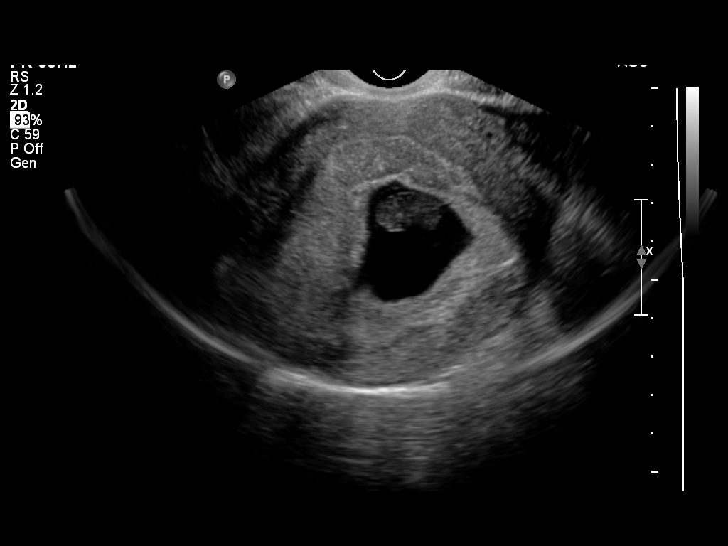
[im 47/64]
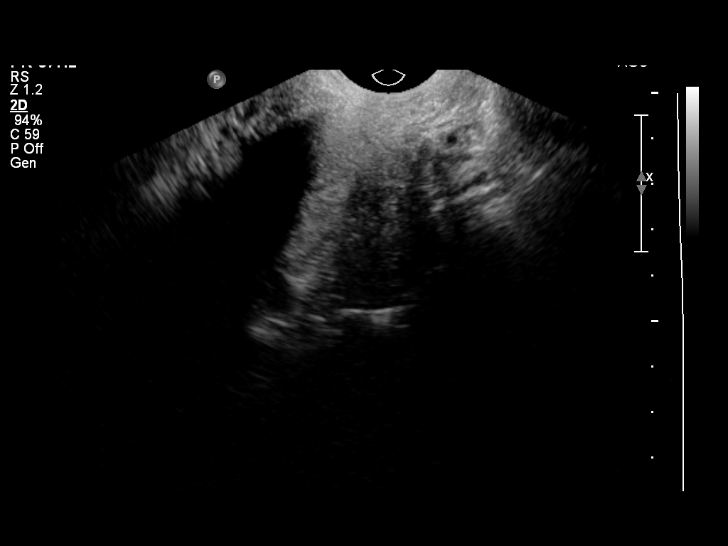
[im 52/64]
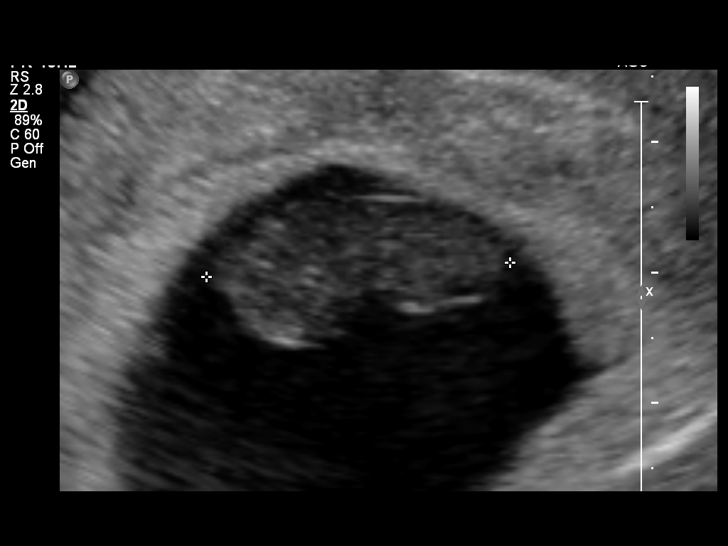
[im 57/64]
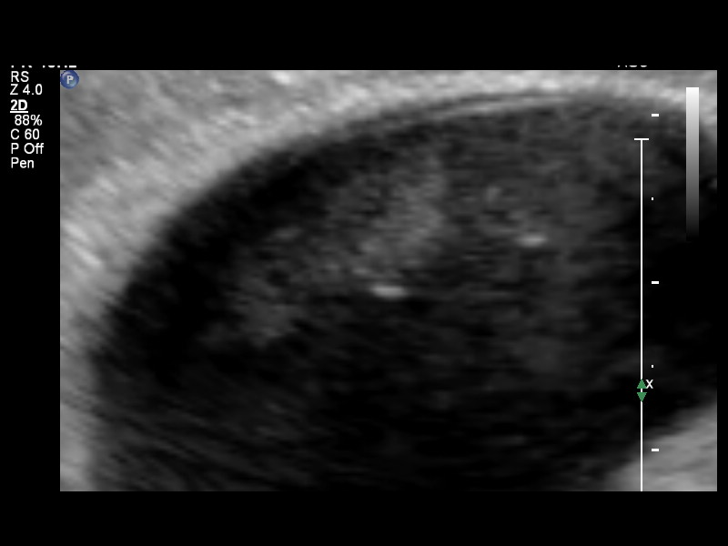
[im 61/64]
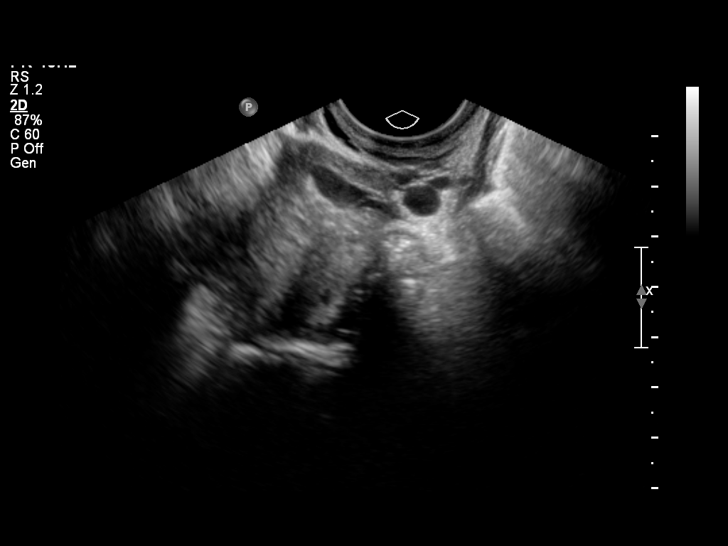

[13 of 28 positions shown; findings below may reference images not displayed]

FINDINGS: Intrauterine gestational sac: Visualized/normal in shape.

Yolk sac:  Not well seen.

Embryo:  Yes

Cardiac Activity: No

Heart Rate: N/A

CRL:  2.21 cm   9 w   0 d                  US EDC: 08/22/2015

Maternal uterus/adnexae: A moderate amount of subchorionic
hemorrhage is noted, measuring 4.3 x 1.5 x 3.7 cm.

The ovaries are unremarkable in appearance. The right ovary measures
3.1 x 1.2 x 2.4 cm, while the left ovary measures 4.3 x 1.5 x
cm. No suspicious adnexal masses are seen; there is no evidence for
ovarian torsion.
IMPRESSION: 1. Single intrauterine pregnancy noted, with a crown-rump length of
2.2 cm, corresponding to a gestational age of 9 weeks 0 days. No
associated fetal heartbeat seen, compatible with fetal demise.
2. Moderate amount of subchorionic hemorrhage noted.

## 2016-08-26 ENCOUNTER — Encounter: Payer: BLUE CROSS/BLUE SHIELD | Admitting: Women's Health

## 2016-08-26 DIAGNOSIS — Z0289 Encounter for other administrative examinations: Secondary | ICD-10-CM

## 2018-07-03 ENCOUNTER — Ambulatory Visit (HOSPITAL_COMMUNITY)
Admission: EM | Admit: 2018-07-03 | Discharge: 2018-07-03 | Disposition: A | Discharge status: Home / Self Care | Payer: BLUE CROSS/BLUE SHIELD | Attending: Emergency Medicine | Admitting: Emergency Medicine

## 2018-07-03 ENCOUNTER — Other Ambulatory Visit: Payer: Self-pay

## 2018-07-03 ENCOUNTER — Encounter (HOSPITAL_COMMUNITY): Payer: Self-pay | Admitting: Emergency Medicine

## 2018-07-03 DIAGNOSIS — J069 Acute upper respiratory infection, unspecified: Secondary | ICD-10-CM | POA: Insufficient documentation

## 2018-07-03 DIAGNOSIS — B9789 Other viral agents as the cause of diseases classified elsewhere: Secondary | ICD-10-CM | POA: Insufficient documentation

## 2018-07-03 MED ORDER — FLUTICASONE PROPIONATE 50 MCG/ACT NA SUSP: 1.0000 | Freq: Every day | NASAL | 0 refills | Status: DC

## 2018-07-03 MED ORDER — PSEUDOEPH-BROMPHEN-DM 30-2-10 MG/5ML PO SYRP: 5.0000 mL | ORAL_SOLUTION | Freq: Four times a day (QID) | ORAL | 0 refills | Status: DC | PRN

## 2018-07-03 MED ORDER — CETIRIZINE HCL 10 MG PO CAPS: 10.0000 mg | ORAL_CAPSULE | Freq: Every day | ORAL | 0 refills | Status: DC

## 2018-07-03 NOTE — Discharge Instructions (Signed)
You likely having a viral upper respiratory infection. We recommended symptom control. I expect your symptoms to start improving in the next 1-2 weeks.   1. Take a daily allergy pill/anti-histamine like Zyrtec, Claritin, or Store brand consistently for 2 weeks- I sent in generic of zyrtec  2. For congestion you may try an oral decongestant like Mucinex or sudafed. You may also try intranasal flonase nasal spray or saline irrigations (neti pot, sinus cleanse)  3. For your sore throat you may try cepacol lozenges, salt water gargles, throat spray. Treatment of congestion may also help your sore throat.  4. For cough you may try cough syrup provided, delsym, Robitussen, Mucinex DM  5. Take Tylenol or Ibuprofen to help with pain/inflammation  6. Stay hydrated, drink plenty of fluids to keep throat coated and less irritated  Honey Tea For cough/sore throat try using a honey-based tea. Use 3 teaspoons of honey with juice squeezed from half lemon. Place shaved pieces of ginger into 1/2-1 cup of water and warm over stove top. Then mix the ingredients and repeat every 4 hours as needed.

## 2018-07-03 NOTE — ED Triage Notes (Signed)
Pt reports a cough and scratchy throat since Friday.  She has been taking Mucinex OTC.

## 2018-07-04 NOTE — ED Provider Notes (Signed)
[place]    CSN: [id number] Arrival date & time: 07/03/18  1105     History   Chief Complaint Chief Complaint  Patient presents with  . Cough    HPI Karen Pollard is a 43 y.o. female history of DM type II, anemia, Patient is presenting with URI symptoms- congestion, cough, sore throat.  Karen Pollard has noticed change in her voice.  Has had some mild chest discomfort. patient's main complaints are overall feeling poor. Symptoms have been going on for 5 days. Patient has tried Mucinex and Synex, with minimal relief. Denies fever, nausea, vomiting, diarrhea. Denies shortness of breath and chest pain.   HPI  Past Medical History:  Diagnosis Date  . Anemia 3-13   TOLD TO TAKE IRON PILLS  . Diabetes mellitus 3-13   BORDERLINE    Patient Active Problem List   Diagnosis Date Noted  . Missed AB 01/20/2015  . Rubella non-immune status 11/14/2012    Past Surgical History:  Procedure Laterality Date  . INNER EAR SURGERY  AS A CHILD   DOES NOT REMEMBER WHICH EAR    OB History    Gravida  1   Para      Term      Preterm      AB      Living        SAB      TAB      Ectopic      Multiple      Live Births               Home Medications    Prior to Admission medications   Medication Sig Start Date End Date Taking? Authorizing Provider  brompheniramine-pseudoephedrine-DM 30-2-10 MG/5ML syrup Take 5 mLs by mouth 4 (four) times daily as needed. 07/03/18   Alfonzo Arca C, PA-C  Cetirizine HCl 10 MG CAPS Take 1 capsule (10 mg total) by mouth daily for 10 days. 07/03/18 07/13/18  Myya Meenach C, PA-C  fluticasone (FLONASE) 50 MCG/ACT nasal spray Place 1-2 sprays into both nostrils daily for 7 days. 07/03/18 07/10/18  Caspar Favila C, PA-C  ibuprofen (ADVIL,MOTRIN) 600 MG tablet Take 1 tablet (600 mg total) by mouth every 6 (six) hours as needed. 08/26/15   Huel Cote, NP  Prenatal Vit-Fe Fumarate-FA (PRENATAL MULTIVITAMIN) TABS tablet Take 1 tablet by  mouth daily at 12 noon. Reported on 08/26/2015    [provider]    Family History Family History  Problem Relation Age of Onset  . Hypertension Father   . Diabetes Father     Social History Social History   Tobacco Use  . Smoking status: Never Smoker  . Smokeless tobacco: Never Used  Substance Use Topics  . Alcohol use: No    Alcohol/week: 0.0 standard drinks    Comment: Rare  . Drug use: No     Allergies   Patient has no known allergies.   Review of Systems Review of Systems  Constitutional: Negative for activity change, appetite change, chills, fatigue and fever.  HENT: Positive for congestion, rhinorrhea, sore throat and voice change. Negative for ear pain, sinus pressure and trouble swallowing.   Eyes: Negative for discharge and redness.  Respiratory: Positive for cough and chest tightness. Negative for shortness of breath.   Cardiovascular: Negative for chest pain.  Gastrointestinal: Negative for abdominal pain, diarrhea, nausea and vomiting.  Musculoskeletal: Negative for myalgias.  Skin: Negative for rash.  Neurological: Negative for dizziness, light-headedness and  headaches.     Physical Exam Triage Vital Signs ED Triage Vitals [07/03/18 1229]  Enc Vitals Group     BP 136/74     Pulse Rate 85     Resp      Temp 98.1 F (36.7 C)     Temp Source Temporal     SpO2 100 %     Weight      Height      Head Circumference      Peak Flow      Pain Score 6     Pain Loc      Pain Edu?      Excl. in [place]?    No data found.  Updated Vital Signs BP 136/74 (BP Location: Left Arm)   Pulse 85   Temp 98.1 F (36.7 C) (Temporal)   LMP 06/16/2018 (Approximate)   SpO2 100%   Visual Acuity Right Eye Distance:   Left Eye Distance:   Bilateral Distance:    Right Eye Near:   Left Eye Near:    Bilateral Near:     Physical Exam Vitals signs and nursing note reviewed.  Constitutional:      General: Karen Pollard is not in acute distress.    Appearance: Karen Pollard  is well-developed.  HENT:     Head: Normocephalic and atraumatic.     Ears:     Comments: Bilateral ears without tenderness to palpation of external auricle, tragus and mastoid, EAC's without erythema or swelling, TM's with good bony landmarks and cone of light. Non erythematous.    Nose:     Comments: Nose mucosa erythematous, rhinorrhea present bilaterally    Mouth/Throat:     Comments: Oral mucosa pink and moist, no tonsillar enlargement or exudate. Posterior pharynx patent and nonerythematous, no uvula deviation or swelling.  Slightly hoarse voice Eyes:     Conjunctiva/sclera: Conjunctivae normal.  Neck:     Musculoskeletal: Neck supple.  Cardiovascular:     Rate and Rhythm: Normal rate and regular rhythm.     Heart sounds: No murmur.  Pulmonary:     Effort: Pulmonary effort is normal. No respiratory distress.     Breath sounds: Normal breath sounds.     Comments: Breathing comfortably at rest, CTABL, no wheezing, rales or other adventitious sounds auscultated Abdominal:     Palpations: Abdomen is soft.     Tenderness: There is no abdominal tenderness.  Skin:    General: Skin is warm and dry.  Neurological:     Mental Status: Karen Pollard is alert.      UC Treatments / Results  Labs (all labs ordered are listed, but only abnormal results are displayed) Labs Reviewed - No data to display  EKG None  Radiology No results found.  Procedures Procedures (including critical care time)  Medications Ordered in UC Medications - No data to display  Initial Impression / Assessment and Plan / UC Course  I have reviewed the triage vital signs and the nursing notes.  Pertinent labs & imaging results that were available during my care of the patient were reviewed by me and considered in my medical decision making (see chart for details).     URI symptoms x5 days, vital signs stable, exam nonfocal.  Likely viral etiology.  Will recommend continued symptomatic and supportive care.   Recommendations below.  Continue to monitor, follow-up if not having any improvement by the end of this weekend.Discussed strict return precautions. Patient verbalized understanding and is agreeable with plan.  Final  Clinical Impressions(s) / UC Diagnoses   Final diagnoses:  Viral URI with cough     Discharge Instructions     You likely having a viral upper respiratory infection. We recommended symptom control. I expect your symptoms to start improving in the next 1-2 weeks.   1. Take a daily allergy pill/anti-histamine like Zyrtec, Claritin, or Store brand consistently for 2 weeks- I sent in generic of zyrtec  2. For congestion you may try an oral decongestant like Mucinex or sudafed. You may also try intranasal flonase nasal spray or saline irrigations (neti pot, sinus cleanse)  3. For your sore throat you may try cepacol lozenges, salt water gargles, throat spray. Treatment of congestion may also help your sore throat.  4. For cough you may try cough syrup provided, delsym, Robitussen, Mucinex DM  5. Take Tylenol or Ibuprofen to help with pain/inflammation  6. Stay hydrated, drink plenty of fluids to keep throat coated and less irritated  Honey Tea For cough/sore throat try using a honey-based tea. Use 3 teaspoons of honey with juice squeezed from half lemon. Place shaved pieces of ginger into 1/2-1 cup of water and warm over stove top. Then mix the ingredients and repeat every 4 hours as needed.   ED Prescriptions    Medication Sig Dispense Auth. Provider   Cetirizine HCl 10 MG CAPS Take 1 capsule (10 mg total) by mouth daily for 10 days. 10 capsule Lavene Penagos C, PA-C   fluticasone (FLONASE) 50 MCG/ACT nasal spray Place 1-2 sprays into both nostrils daily for 7 days. 1 g Camdyn Beske C, PA-C   brompheniramine-pseudoephedrine-DM 30-2-10 MG/5ML syrup Take 5 mLs by mouth 4 (four) times daily as needed. 120 mL Jeromey Kruer C, PA-C     Controlled Substance  Prescriptions  Controlled Substance Registry consulted? Not Applicable   Janith Lima, [place] 07/04/18 1043

## 2019-10-12 ENCOUNTER — Ambulatory Visit: Payer: Self-pay | Attending: Internal Medicine

## 2019-10-12 DIAGNOSIS — Z23 Encounter for immunization: Secondary | ICD-10-CM

## 2019-10-12 NOTE — Progress Notes (Signed)
   Covid-19 Vaccination Clinic  Name:  Treasa Helgesen    MRN: [id number] DOB: 1976-03-25  10/12/2019  Ms. Breisch was observed post Covid-19 immunization for 15 minutes without incident. She was provided with Vaccine Information Sheet and instruction to access the V-Safe system.   Ms. Elick was instructed to call 911 with any severe reactions post vaccine: Marland Kitchen Difficulty breathing  . Swelling of face and throat  . A fast heartbeat  . A bad rash all over body  . Dizziness and weakness   Immunizations Administered    Name Date Dose VIS Date Route   Pfizer COVID-19 Vaccine 10/12/2019  4:23 PM 0.3 mL 06/07/2019 Intramuscular   Manufacturer: [place]   Lot: [id number]   [place]: [id number]

## 2019-11-04 ENCOUNTER — Ambulatory Visit: Payer: Self-pay | Attending: Internal Medicine

## 2019-11-04 DIAGNOSIS — Z23 Encounter for immunization: Secondary | ICD-10-CM

## 2019-11-04 NOTE — Progress Notes (Signed)
   Covid-19 Vaccination Clinic  Name:  Karen Pollard    MRN: [id number] DOB: [birth year]  11/04/2019  Karen Pollard was observed post Covid-19 immunization for 15 minutes without incident. She was provided with Vaccine Information Sheet and instruction to access the V-Safe system.   Karen Pollard was instructed to call 911 with any severe reactions post vaccine: Marland Kitchen Difficulty breathing  . Swelling of face and throat  . A fast heartbeat  . A bad rash all over body  . Dizziness and weakness   Immunizations Administered    Name Date Dose VIS Date Route   Pfizer COVID-19 Vaccine 11/04/2019  4:13 PM 0.3 mL 08/21/2018 Intramuscular   Manufacturer: [place]   Lot: [id number]   [place]: [id number]

## 2021-09-07 ENCOUNTER — Ambulatory Visit (INDEPENDENT_AMBULATORY_CARE_PROVIDER_SITE_OTHER): Payer: BC Managed Care – PPO | Admitting: Nurse Practitioner

## 2021-09-07 ENCOUNTER — Encounter: Payer: Self-pay | Admitting: Nurse Practitioner

## 2021-09-07 ENCOUNTER — Other Ambulatory Visit: Payer: Self-pay

## 2021-09-07 VITALS — BP 131/84 | HR 88 | Resp 18 | Ht 64.0 in | Wt 195.0 lb

## 2021-09-07 DIAGNOSIS — Z7689 Persons encountering health services in other specified circumstances: Secondary | ICD-10-CM | POA: Diagnosis not present

## 2021-09-07 DIAGNOSIS — R030 Elevated blood-pressure reading, without diagnosis of hypertension: Secondary | ICD-10-CM

## 2021-09-07 NOTE — Assessment & Plan Note (Signed)
1. Encounter to establish care ? ? ?2. Elevated blood pressure reading without diagnosis of hypertension ? ?DASH diet ? ?Stay well hydrated ? ?May start walking routine ? ?Follow up: ? ?Follow up in 4-6 weeks for complete physical ?

## 2021-09-07 NOTE — Patient Instructions (Signed)
1. Encounter to establish care ? ? ?2. Elevated blood pressure reading without diagnosis of hypertension ? ?DASH diet ? ?Stay well hydrated ? ?May start walking routine ? ?Follow up: ? ?Follow up in 4-6 weeks for complete physical ? ?DASH Eating Plan ?DASH stands for Dietary Approaches to Stop Hypertension. The DASH eating plan is a healthy eating plan that has been shown to: ?Reduce high blood pressure (hypertension). ?Reduce your risk for type 2 diabetes, heart disease, and stroke. ?Help with weight loss. ?What are tips for following this plan? ?Reading food labels ?Check food labels for the amount of salt (sodium) per serving. Choose foods with less than 5 percent of the Daily Value of sodium. Generally, foods with less than 300 milligrams (mg) of sodium per serving fit into this eating plan. ?To find whole grains, look for the word "whole" as the first word in the ingredient list. ?Shopping ?Buy products labeled as "low-sodium" or "no salt added." ?Buy fresh foods. Avoid canned foods and pre-made or frozen meals. ?Cooking ?Avoid adding salt when cooking. Use salt-free seasonings or herbs instead of table salt or sea salt. Check with your health care provider or pharmacist before using salt substitutes. ?Do not fry foods. Cook foods using healthy methods such as baking, boiling, grilling, roasting, and broiling instead. ?Cook with heart-healthy oils, such as olive, canola, avocado, soybean, or sunflower oil. ?Meal planning ? ?Eat a balanced diet that includes: ?4 or more servings of fruits and 4 or more servings of vegetables each day. Try to fill one-half of your plate with fruits and vegetables. ?6-8 servings of whole grains each day. ?Less than 6 oz (170 g) of lean meat, poultry, or fish each day. A 3-oz (85-g) serving of meat is about the same size as a deck of cards. One egg equals 1 oz (28 g). ?2-3 servings of low-fat dairy each day. One serving is 1 cup (237 mL). ?1 serving of nuts, seeds, or beans 5 times  each week. ?2-3 servings of heart-healthy fats. Healthy fats called omega-3 fatty acids are found in foods such as walnuts, flaxseeds, fortified milks, and eggs. These fats are also found in cold-water fish, such as sardines, salmon, and mackerel. ?Limit how much you eat of: ?Canned or prepackaged foods. ?Food that is high in trans fat, such as some fried foods. ?Food that is high in saturated fat, such as fatty meat. ?Desserts and other sweets, sugary drinks, and other foods with added sugar. ?Full-fat dairy products. ?Do not salt foods before eating. ?Do not eat more than 4 egg yolks a week. ?Try to eat at least 2 vegetarian meals a week. ?Eat more home-cooked food and less restaurant, buffet, and fast food. ?Lifestyle ?When eating at a restaurant, ask that your food be prepared with less salt or no salt, if possible. ?If you drink alcohol: ?Limit how much you use to: ?0-1 drink a day for women who are not pregnant. ?0-2 drinks a day for men. ?Be aware of how much alcohol is in your drink. In the [place] one drink equals one 12 oz bottle of beer (355 mL), one 5 oz glass of wine (148 mL), or one 1? oz glass of hard liquor (44 mL). ?General information ?Avoid eating more than 2,300 mg of salt a day. If you have hypertension, you may need to reduce your sodium intake to 1,500 mg a day. ?Work with your health care provider to maintain a healthy body weight or to lose weight. Ask what an ideal  weight is for you. ?Get at least 30 minutes of exercise that causes your heart to beat faster (aerobic exercise) most days of the week. Activities may include walking, swimming, or biking. ?Work with your health care provider or dietitian to adjust your eating plan to your individual calorie needs. ?What foods should I eat? ?Fruits ?All fresh, dried, or frozen fruit. Canned fruit in natural juice (without added sugar). ?Vegetables ?Fresh or frozen vegetables (raw, steamed, roasted, or grilled). Low-sodium or reduced-sodium tomato  and vegetable juice. Low-sodium or reduced-sodium tomato sauce and tomato paste. Low-sodium or reduced-sodium canned vegetables. ?Grains ?Whole-grain or whole-wheat bread. Whole-grain or whole-wheat pasta. Brown rice. [place] Morrow. Bulgur. Whole-grain and low-sodium cereals. Pita bread. Low-fat, low-sodium crackers. Whole-wheat flour tortillas. ?Meats and other proteins ?Skinless chicken or [place]. Ground chicken or [place]. Pork with fat trimmed off. Fish and seafood. Egg whites. Dried beans, peas, or lentils. Unsalted nuts, nut butters, and seeds. Unsalted canned beans. Lean cuts of beef with fat trimmed off. Low-sodium, lean precooked or cured meat, such as sausages or meat loaves. ?Dairy ?Low-fat (1%) or fat-free (skim) milk. Reduced-fat, low-fat, or fat-free cheeses. Nonfat, low-sodium ricotta or cottage cheese. Low-fat or nonfat yogurt. Low-fat, low-sodium cheese. ?Fats and oils ?Soft margarine without trans fats. Vegetable oil. Reduced-fat, low-fat, or light mayonnaise and salad dressings (reduced-sodium). Canola, safflower, olive, avocado, soybean, and sunflower oils. Avocado. ?Seasonings and condiments ?Herbs. Spices. Seasoning mixes without salt. ?Other foods ?Unsalted popcorn and pretzels. Fat-free sweets. ?The items listed above may not be a complete list of foods and beverages you can eat. Contact a dietitian for more information. ?What foods should I avoid? ?Fruits ?Canned fruit in a light or heavy syrup. Fried fruit. Fruit in cream or butter sauce. ?Vegetables ?Creamed or fried vegetables. Vegetables in a cheese sauce. Regular canned vegetables (not low-sodium or reduced-sodium). Regular canned tomato sauce and paste (not low-sodium or reduced-sodium). Regular tomato and vegetable juice (not low-sodium or reduced-sodium). Angie Fava. Olives. ?Grains ?Baked goods made with fat, such as croissants, muffins, or some breads. Dry pasta or rice meal packs. ?Meats and other proteins ?Fatty cuts of meat. Ribs.  Fried meat. Berniece Salines. Bologna, salami, and other precooked or cured meats, such as sausages or meat loaves. Fat from the back of a pig (fatback). Bratwurst. Salted nuts and seeds. Canned beans with added salt. Canned or smoked fish. Whole eggs or egg yolks. Chicken or [place] with skin. ?Dairy ?Whole or 2% milk, cream, and half-and-half. Whole or full-fat cream cheese. Whole-fat or sweetened yogurt. Full-fat cheese. Nondairy creamers. Whipped toppings. Processed cheese and cheese spreads. ?Fats and oils ?Butter. Stick margarine. Lard. Shortening. Ghee. Bacon fat. Tropical oils, such as coconut, palm kernel, or palm oil. ?Seasonings and condiments ?Onion salt, garlic salt, seasoned salt, table salt, and sea salt. Worcestershire sauce. Tartar sauce. Barbecue sauce. Teriyaki sauce. Soy sauce, including reduced-sodium. Steak sauce. Canned and packaged gravies. Fish sauce. Oyster sauce. Cocktail sauce. Store-bought horseradish. Ketchup. Mustard. Meat flavorings and tenderizers. Bouillon cubes. Hot sauces. Pre-made or packaged marinades. Pre-made or packaged taco seasonings. Relishes. Regular salad dressings. ?Other foods ?Salted popcorn and pretzels. ?The items listed above may not be a complete list of foods and beverages you should avoid. Contact a dietitian for more information. ?Where to find more information ?National Heart, Lung, and Blood Institute: https://wilson-eaton.com/ ?American Heart Association: www.heart.org ?Academy of Nutrition and Dietetics: www.eatright.org ?[place]: www.kidney.org ?Summary ?The DASH eating plan is a healthy eating plan that has been shown to reduce high blood pressure (  hypertension). It may also reduce your risk for type 2 diabetes, heart disease, and stroke. ?When on the DASH eating plan, aim to eat more fresh fruits and vegetables, whole grains, lean proteins, low-fat dairy, and heart-healthy fats. ?With the DASH eating plan, you should limit salt (sodium) intake to 2,300 mg  a day. If you have hypertension, you may need to reduce your sodium intake to 1,500 mg a day. ?Work with your health care provider or dietitian to adjust your eating plan to your individual calorie

## 2021-09-07 NOTE — Progress Notes (Signed)
? ?'@Patient'$  ID: Karen Pollard, female    DOB: 09/03/1975, 46 y.o.   MRN: [id number] ? ?Chief Complaint  ?Patient presents with  ? Establish Care  ? ? ?Referring provider: ?No ref. provider found ? ?HPI ? ?Patient presents today to establish care.  She states that she is not currently on any prescription medications.  She states that she does not have any significant health history.  Patient has not had a PCP and has not had a physical in quite some time.  We discussed that we will get her back in the near future for complete physical with blood work.  Upon arrival to the exam room today her blood pressure was noted to be borderline elevated.  When rechecked her blood pressure was 131/84.  We discussed keeping a check on her blood pressure, starting exercise routine, eating healthy. Denies f/c/s, n/v/d, hemoptysis, PND, chest pain or edema. ? ? ?Elevated BP ? ? ? ?No Known Allergies ? ?Immunization History  ?Administered Date(s) Administered  ? PFIZER(Purple Top)SARS-COV-2 Vaccination 10/12/2019, 11/04/2019  ? ? ?Past Medical History:  ?Diagnosis Date  ? Anemia 3-13  ? TOLD TO TAKE IRON PILLS  ? Diabetes mellitus 3-13  ? BORDERLINE  ? ? ?Tobacco History: ?Social History  ? ?Tobacco Use  ?Smoking Status Never  ?Smokeless Tobacco Never  ? ?Counseling given: Not Answered ? ? ?Outpatient Encounter Medications as of 09/07/2021  ?Medication Sig  ? brompheniramine-pseudoephedrine-DM 30-2-10 MG/5ML syrup Take 5 mLs by mouth 4 (four) times daily as needed. (Patient not taking: Reported on 09/07/2021)  ? Cetirizine HCl 10 MG CAPS Take 1 capsule (10 mg total) by mouth daily for 10 days.  ? fluticasone (FLONASE) 50 MCG/ACT nasal spray Place 1-2 sprays into both nostrils daily for 7 days.  ? ibuprofen (ADVIL,MOTRIN) 600 MG tablet Take 1 tablet (600 mg total) by mouth every 6 (six) hours as needed. (Patient not taking: Reported on 09/07/2021)  ? Prenatal Vit-Fe Fumarate-FA (PRENATAL MULTIVITAMIN) TABS tablet Take 1 tablet by  mouth daily at 12 noon. Reported on 08/26/2015 (Patient not taking: Reported on 09/07/2021)  ? ?No facility-administered encounter medications on file as of 09/07/2021.  ? ? ? ?Review of Systems ? ?Review of Systems  ?Constitutional: Negative.   ?HENT: Negative.    ?Cardiovascular: Negative.   ?Gastrointestinal: Negative.   ?Allergic/Immunologic: Negative.   ?Neurological: Negative.   ?Psychiatric/Behavioral: Negative.     ? ? ? ?Physical Exam ? ?BP 131/84 (BP Location: Right Arm, Cuff Size: Normal)   Pulse 88   Resp 18   Ht '5\' 4"'$  (1.626 m)   Wt 195 lb (88.5 kg)   LMP 08/24/2021   SpO2 97%   BMI 33.47 kg/m?  ? ?Wt Readings from Last 5 Encounters:  ?09/07/21 195 lb (88.5 kg)  ?08/26/15 191 lb (86.6 kg)  ?01/21/15 178 lb (80.7 kg)  ?01/20/15 178 lb (80.7 kg)  ?01/17/15 180 lb 3.2 oz (81.7 kg)  ? ? ? ?Physical Exam ?Vitals and nursing note reviewed.  ?Constitutional:   ?   General: She is not in acute distress. ?   Appearance: She is well-developed.  ?Cardiovascular:  ?   Rate and Rhythm: Normal rate and regular rhythm.  ?Pulmonary:  ?   Effort: Pulmonary effort is normal.  ?   Breath sounds: Normal breath sounds.  ?Neurological:  ?   Mental Status: She is alert and oriented to person, place, and time.  ? ? ? ?Lab Results: ? ?CBC ?   ?Component Value  Date/Time  ? WBC 7.5 08/26/2015 1537  ? RBC 4.43 08/26/2015 1537  ? HGB 12.2 08/26/2015 1537  ? HCT 37.3 08/26/2015 1537  ? PLT 228 08/26/2015 1537  ? MCV 84.2 08/26/2015 1537  ? MCV 75.0 (A) 09/05/2011 1105  ? MCH 27.5 08/26/2015 1537  ? MCHC 32.7 08/26/2015 1537  ? RDW 15.1 08/26/2015 1537  ? LYMPHSABS 2.3 08/26/2015 1537  ? MONOABS 0.5 08/26/2015 1537  ? EOSABS 0.5 08/26/2015 1537  ? BASOSABS 0.1 08/26/2015 1537  ? ? ?BMET ?   ?Component Value Date/Time  ? NA 138 08/26/2015 1537  ? K 4.2 08/26/2015 1537  ? CL 103 08/26/2015 1537  ? CO2 24 08/26/2015 1537  ? GLUCOSE 75 08/26/2015 1537  ? BUN 19 08/26/2015 1537  ? CREATININE 0.67 08/26/2015 1537  ? CALCIUM 9.3  08/26/2015 1537  ? ? ?BNP ?No results found for: BNP ? ?ProBNP ?No results found for: PROBNP ? ?Imaging: ?No results found. ? ? ?Assessment & Plan:  ? ?Elevated blood pressure reading without diagnosis of hypertension ?1. Encounter to establish care ? ? ?2. Elevated blood pressure reading without diagnosis of hypertension ? ?DASH diet ? ?Stay well hydrated ? ?May start walking routine ? ?Follow up: ? ?Follow up in 4-6 weeks for complete physical ? ? ? ? ?Fenton Foy, NP ?09/07/2021 ? ?

## 2021-10-07 NOTE — Progress Notes (Deleted)
Patient ID: Karen Pollard, female    DOB: 04/10/1976  MRN: [id number]  CC: Annual Physical Exam  Subjective: Karen Pollard is a 46 y.o. female who presents for annual physical exam.   Her concerns today include:  BP Check  Mammo  PAP   Patient Active Problem List   Diagnosis Date Noted   Elevated blood pressure reading without diagnosis of hypertension 09/07/2021   Missed AB 01/20/2015   Rubella non-immune status 11/14/2012     Current Outpatient Medications on File Prior to Visit  Medication Sig Dispense Refill   brompheniramine-pseudoephedrine-DM 30-2-10 MG/5ML syrup Take 5 mLs by mouth 4 (four) times daily as needed. (Patient not taking: Reported on 09/07/2021) 120 mL 0   Cetirizine HCl 10 MG CAPS Take 1 capsule (10 mg total) by mouth daily for 10 days. 10 capsule 0   fluticasone (FLONASE) 50 MCG/ACT nasal spray Place 1-2 sprays into both nostrils daily for 7 days. 1 g 0   ibuprofen (ADVIL,MOTRIN) 600 MG tablet Take 1 tablet (600 mg total) by mouth every 6 (six) hours as needed. (Patient not taking: Reported on 09/07/2021) 60 tablet 1   Prenatal Vit-Fe Fumarate-FA (PRENATAL MULTIVITAMIN) TABS tablet Take 1 tablet by mouth daily at 12 noon. Reported on 08/26/2015 (Patient not taking: Reported on 09/07/2021)     No current facility-administered medications on file prior to visit.    No Known Allergies  Social History   Socioeconomic History   Marital status: Married    Spouse name: Not on file   Number of children: Not on file   Years of education: Not on file   Highest education level: Not on file  Occupational History   Not on file  Tobacco Use   Smoking status: Never   Smokeless tobacco: Never  Substance and Sexual Activity   Alcohol use: No    Alcohol/week: 0.0 standard drinks    Comment: Rare   Drug use: No   Sexual activity: Yes    Partners: Male    Birth control/protection: None    Comment: INTERCOURSE AGE 45,  SEXUAL PARTNERS LESS THAN 5  Other  Topics Concern   Not on file  Social History Narrative   Not on file   Social Determinants of Health   Financial Resource Strain: Not on file  Food Insecurity: Not on file  Transportation Needs: Not on file  Physical Activity: Not on file  Stress: Not on file  Social Connections: Not on file  Intimate Partner Violence: Not on file    Family History  Problem Relation Age of Onset   Hypertension Father    Diabetes Father     Past Surgical History:  Procedure Laterality Date   INNER EAR SURGERY  AS A CHILD   DOES NOT REMEMBER WHICH EAR    ROS: Review of Systems Negative except as stated above  PHYSICAL EXAM: There were no vitals taken for this visit.  Physical Exam  {female adult master:310786} {female adult master:310785}     Latest Ref Rng & Units 08/26/2015    3:37 PM 08/07/2014    4:29 PM 11/14/2012    4:20 PM  CMP  Glucose 65 - 99 mg/dL 75   79   83    BUN 7 - 25 mg/dL 19      Creatinine 0.50 - 1.10 mg/dL 0.67      Sodium 135 - 146 mmol/L 138      Potassium 3.5 - 5.3 mmol/L 4.2  Chloride 98 - 110 mmol/L 103      CO2 20 - 31 mmol/L 24      Calcium 8.6 - 10.2 mg/dL 9.3      Total Protein 6.1 - 8.1 g/dL 7.1      Total Bilirubin 0.2 - 1.2 mg/dL 0.4      Alkaline Phos 33 - 115 U/L 63      AST 10 - 30 U/L 20      ALT 6 - 29 U/L 22       Lipid Panel     Component Value Date/Time   CHOL 200 08/26/2015 1537   TRIG 86 08/26/2015 1537   HDL 52 08/26/2015 1537   CHOLHDL 3.8 08/26/2015 1537   VLDL 17 08/26/2015 1537   LDLCALC 131 (H) 08/26/2015 1537    CBC    Component Value Date/Time   WBC 7.5 08/26/2015 1537   RBC 4.43 08/26/2015 1537   HGB 12.2 08/26/2015 1537   HCT 37.3 08/26/2015 1537   PLT 228 08/26/2015 1537   MCV 84.2 08/26/2015 1537   MCV 75.0 (A) 09/05/2011 1105   MCH 27.5 08/26/2015 1537   MCHC 32.7 08/26/2015 1537   RDW 15.1 08/26/2015 1537   LYMPHSABS 2.3 08/26/2015 1537   MONOABS 0.5 08/26/2015 1537   EOSABS 0.5 08/26/2015 1537    BASOSABS 0.1 08/26/2015 1537    ASSESSMENT AND PLAN:  There are no diagnoses linked to this encounter.   Patient was given the opportunity to ask questions.  Patient verbalized understanding of the plan and was able to repeat key elements of the plan. Patient was given clear instructions to go to Emergency Department or return to medical center if symptoms don't improve, worsen, or new problems develop.The patient verbalized understanding.   No orders of the defined types were placed in this encounter.    Requested Prescriptions    No prescriptions requested or ordered in this encounter    No follow-ups on file.  Camillia Herter, NP

## 2021-10-13 ENCOUNTER — Encounter: Payer: BC Managed Care – PPO | Admitting: Family

## 2021-10-13 DIAGNOSIS — Z13220 Encounter for screening for lipoid disorders: Secondary | ICD-10-CM

## 2021-10-13 DIAGNOSIS — Z124 Encounter for screening for malignant neoplasm of cervix: Secondary | ICD-10-CM

## 2021-10-13 DIAGNOSIS — Z130 Encounter for screening for diseases of the blood and blood-forming organs and certain disorders involving the immune mechanism: Secondary | ICD-10-CM

## 2021-10-13 DIAGNOSIS — Z1329 Encounter for screening for other suspected endocrine disorder: Secondary | ICD-10-CM

## 2021-10-13 DIAGNOSIS — Z131 Encounter for screening for diabetes mellitus: Secondary | ICD-10-CM

## 2021-10-13 DIAGNOSIS — Z113 Encounter for screening for infections with a predominantly sexual mode of transmission: Secondary | ICD-10-CM

## 2021-10-13 DIAGNOSIS — Z1231 Encounter for screening mammogram for malignant neoplasm of breast: Secondary | ICD-10-CM

## 2021-10-13 DIAGNOSIS — Z13228 Encounter for screening for other metabolic disorders: Secondary | ICD-10-CM

## 2021-10-13 DIAGNOSIS — Z0000 Encounter for general adult medical examination without abnormal findings: Secondary | ICD-10-CM

## 2021-10-13 DIAGNOSIS — Z1159 Encounter for screening for other viral diseases: Secondary | ICD-10-CM

## 2024-03-21 ENCOUNTER — Ambulatory Visit: Payer: Self-pay | Admitting: Allergy & Immunology

## 2024-04-11 ENCOUNTER — Encounter: Payer: Self-pay | Admitting: Allergy & Immunology

## 2024-04-11 ENCOUNTER — Ambulatory Visit: Payer: Self-pay | Admitting: Allergy & Immunology

## 2024-04-11 ENCOUNTER — Other Ambulatory Visit: Payer: Self-pay

## 2024-04-11 VITALS — BP 124/90 | HR 93 | Temp 99.4°F | Resp 16 | Ht 63.75 in | Wt 189.8 lb

## 2024-04-11 DIAGNOSIS — R220 Localized swelling, mass and lump, head: Secondary | ICD-10-CM | POA: Diagnosis not present

## 2024-04-11 DIAGNOSIS — J310 Chronic rhinitis: Secondary | ICD-10-CM

## 2024-04-11 DIAGNOSIS — R221 Localized swelling, mass and lump, neck: Secondary | ICD-10-CM | POA: Diagnosis not present

## 2024-04-11 DIAGNOSIS — L508 Other urticaria: Secondary | ICD-10-CM

## 2024-04-11 NOTE — Progress Notes (Signed)
 NEW PATIENT  Date of Service/Encounter:  04/11/24  Consult requested by: Patient, No Pcp Per   Assessment:   Swelling of lip, tongue, and throat  Chronic rhinitis - planning for skin testing at the next visit  Chronic urticaria   Plan/Recommendations:   1. Swelling of lip, tongue, and throat - with urticaria (hives) - Your history does not have any red flags such as fevers, joint pains, or permanent skin changes that would be concerning for a more serious cause of hives.  - We will get some labs to rule out serious causes of hives: alpha gal panel complete blood count, tryptase level, chronic urticaria panel, CMP, ESR, and CRP. - Chronic hives are often times a self limited process and will burn themselves out over 6-12 months, although this is not always the case.  - In the meantime, start suppressive dosing of antihistamines AT THE FIRST SIGN OF A HIVE/SWELLING OUTBREAK for 3-5 days:    - Morning: Zyrtec  (cetirizine ) 10mg  + Pepcid (famotidine) 20mg    - Evening: Zyrtec  (cetirizine ) 10mg  + Pepcid (famotidine) 20mg   - You can change this dosing at home, decreasing the dose as needed or increasing the dosing as needed.  - If you are not tolerating the medications or are tired of taking them every day, we can start treatment with a monthly injectable medication called Xolair.   2. Chronic rhinitis - Because of insurance stipulations, we cannot do skin testing on the same day as your first visit. - We are all working to fight this, but for now we need to do two separate visits.  - We will know more after we do testing at the next visit.  - The skin testing visit can be squeezed in at your convenience.  - Then we can make a more full plan to address all of your symptoms. - Be sure to stop your antihistamines for 3 days before this appointment.   3. Return in about 1 week (around 04/18/2024) for SKIN TESTING. You can have the follow up appointment with Dr. Iva or a Nurse  Practicioner (our Nurse Practitioners are excellent and always have Physician oversight!).    This note in its entirety was forwarded to the Provider who requested this consultation.  Subjective:   Karen Pollard is a 48 y.o. female presenting today for evaluation of  Chief Complaint  Patient presents with   Establish Care    She had lip, throat and forehead swelling a month ago after eating some foods from [place] and Karen Pollard.     Karen Pollard has a history of the following: Patient Active Problem List   Diagnosis Date Noted   Elevated blood pressure reading without diagnosis of hypertension 09/07/2021   Missed AB 01/20/2015   Not immune to rubella 11/14/2012    History obtained from: chart review and patient.  Discussed the use of AI scribe software for clinical note transcription with the patient and/or guardian, who gave verbal consent to proceed.  Karen Pollard was referred by Patient, No Pcp Per.     Karen Pollard is a 48 y.o. female presenting for an evaluation of urticaria and angioedema.  She experiences recurrent episodes of swelling, primarily affecting her lips and occasionally her tongue. The swelling begins with a tingling sensation in her lips, which then become puffy. These episodes have occurred approximately three times over the past summer and sporadically over the past year. One significant episode involved tongue swelling that caused difficulty breathing while  lying in bed. She has not required hospitalization and manages these episodes with Benadryl or a combination medication containing Tylenol , Benadryl, and phenylephrine. Ibuprofen  and Tylenol  alone have previously caused adverse reactions.  Potential triggers include various foods from fast-food restaurants like [employer] and Karen Pollard, though she is uncertain of the exact cause. She denies any new medications or changes in personal care products such as toothpaste or chapstick.   There is no family  history of similar swelling episodes or autoimmune diseases. She denies any recent dental work or changes in Administrator products. She has not experienced unexplained stomach pain or symptoms related to dental issues.   Allergic Rhinitis Symptom History: She does not take antihistamines daily but uses them during flare-ups. She experiences environmental allergy symptoms such as a runny nose and uses the same allergy medication for these symptoms. She denies using nasal sprays. Occasionally, she develops hives or small bumps on her body, which resolve within a day.  Infection Symptom History: She has a history of ear and sinus infections, with the last occurrence being last year, which she manages without medical intervention.     Otherwise, there is no history of other atopic diseases, including asthma, drug allergies, stinging insect allergies, eczema, urticaria, or contact dermatitis. There is no significant infectious history. Vaccinations are up to date.    Past Medical History: Patient Active Problem List   Diagnosis Date Noted   Elevated blood pressure reading without diagnosis of hypertension 09/07/2021   Missed AB 01/20/2015   Not immune to rubella 11/14/2012    Medication List:  Allergies as of 04/11/2024   No Known Allergies      Medication List        Accurate as of April 11, 2024 11:59 PM. If you have any questions, ask your nurse or doctor.          STOP taking these medications    brompheniramine-pseudoephedrine-DM 30-2-10 MG/5ML syrup Stopped by: Aleksi Brummet Louis Aydee Mcnew   Cetirizine  HCl 10 MG Caps Stopped by: Marty Morton Shaggy   fluticasone  50 MCG/ACT nasal spray Commonly known as: FLONASE  Stopped by: Marty Morton Shaggy   ibuprofen  600 MG tablet Commonly known as: ADVIL  Stopped by: Marty Morton Shaggy   prenatal multivitamin Tabs tablet Stopped by: Marty Morton Shaggy        Birth History: non-contributory  Developmental History:  non-contributory  Past Surgical History: Past Surgical History:  Procedure Laterality Date   INNER EAR SURGERY  AS A CHILD   DOES NOT REMEMBER WHICH EAR     Family History: Family History  Problem Relation Age of Onset   Hypertension Father    Diabetes Father      Social History: Jeraldin lives at home with her family.  She lives in a trailer that is 46 years old.  There is hardwood in the main living areas and carpeting in the bedroom.  She has electric heating and central cooling.  There are no animals inside or outside of the home.  There are no dust mite covers on the bedding.  There is no tobacco exposure.  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  There is no fume, chemical, or dust exposure.  There is no HEPA filter in the home.  She does not live near an interstate or industrial area.   Review of systems otherwise negative other than that mentioned in the HPI.    Objective:   Blood pressure (!) 124/90, pulse 93,  temperature 99.4 F (37.4 C), temperature source Temporal, resp. rate 16, height 5' 3.75 (1.619 m), weight 189 lb 12.8 oz (86.1 kg), SpO2 99%, unknown if currently breastfeeding. Body mass index is 32.84 kg/m.     Physical Exam Vitals reviewed.  Constitutional:      Appearance: She is well-developed.     Comments: Friendly.  HENT:     Head: Normocephalic and atraumatic.     Right Ear: Tympanic membrane, ear canal and external ear normal. No drainage, swelling or tenderness. Tympanic membrane is not injected, scarred, erythematous, retracted or bulging.     Left Ear: Tympanic membrane, ear canal and external ear normal. No drainage, swelling or tenderness. Tympanic membrane is not injected, scarred, erythematous, retracted or bulging.     Nose: No nasal deformity, septal deviation, mucosal edema or rhinorrhea.     Right Turbinates: Enlarged and swollen.     Left Turbinates: Enlarged and swollen.     Right Sinus: No  maxillary sinus tenderness or frontal sinus tenderness.     Left Sinus: No maxillary sinus tenderness or frontal sinus tenderness.     Mouth/Throat:     Mouth: Mucous membranes are not pale and not dry.     Pharynx: Uvula midline.  Eyes:     General: Lids are normal. Allergic shiner present.        Right eye: No discharge.        Left eye: No discharge.     Conjunctiva/sclera: Conjunctivae normal.     Right eye: Right conjunctiva is not injected. No chemosis.    Left eye: Left conjunctiva is not injected. No chemosis.    Pupils: Pupils are equal, round, and reactive to light.  Cardiovascular:     Rate and Rhythm: Normal rate and regular rhythm.     Heart sounds: Normal heart sounds.  Pulmonary:     Effort: Pulmonary effort is normal. No tachypnea, accessory muscle usage or respiratory distress.     Breath sounds: Normal breath sounds. No wheezing, rhonchi or rales.     Comments: Moving air well in all lung fields.  No increased work of breathing. Chest:     Chest wall: No tenderness.  Abdominal:     Tenderness: There is no abdominal tenderness. There is no guarding or rebound.  Lymphadenopathy:     Head:     Right side of head: No submandibular, tonsillar or occipital adenopathy.     Left side of head: No submandibular, tonsillar or occipital adenopathy.     Cervical: No cervical adenopathy.  Skin:    General: Skin is warm.     Capillary Refill: Capillary refill takes less than 2 seconds.     Coloration: Skin is not pale.     Findings: No abrasion, erythema, petechiae or rash. Rash is not papular, urticarial or vesicular.  Neurological:     Mental Status: She is alert.  Psychiatric:        Behavior: Behavior is cooperative.      Diagnostic studies: labs sent instead with plans to do skin testing at the next visit          Marty Shaggy, MD [facility] 

## 2024-04-11 NOTE — Patient Instructions (Addendum)
 1. Swelling of lip, tongue, and throat - with urticaria (hives) - Your history does not have any red flags such as fevers, joint pains, or permanent skin changes that would be concerning for a more serious cause of hives.  - We will get some labs to rule out serious causes of hives: alpha gal panel complete blood count, tryptase level, chronic urticaria panel, CMP, ESR, and CRP. - Chronic hives are often times a self limited process and will burn themselves out over 6-12 months, although this is not always the case.  - In the meantime, start suppressive dosing of antihistamines AT THE FIRST SIGN OF A HIVE/SWELLING OUTBREAK for 3-5 days:    - Morning: Zyrtec  (cetirizine ) 10mg  + Pepcid (famotidine) 20mg    - Evening: Zyrtec  (cetirizine ) 10mg  + Pepcid (famotidine) 20mg   - You can change this dosing at home, decreasing the dose as needed or increasing the dosing as needed.  - If you are not tolerating the medications or are tired of taking them every day, we can start treatment with a monthly injectable medication called Xolair.   2. Chronic rhinitis - Because of insurance stipulations, we cannot do skin testing on the same day as your first visit. - We are all working to fight this, but for now we need to do two separate visits.  - We will know more after we do testing at the next visit.  - The skin testing visit can be squeezed in at your convenience.  - Then we can make a more full plan to address all of your symptoms. - Be sure to stop your antihistamines for 3 days before this appointment.   3. Return in about 1 week (around 04/18/2024) for SKIN TESTING. You can have the follow up appointment with Dr. Iva or a Nurse Practicioner (our Nurse Practitioners are excellent and always have Physician oversight!).    Please inform us  of any Emergency Department visits, hospitalizations, or changes in symptoms. Call us  before going to the ED for breathing or allergy symptoms since we might be  able to fit you in for a sick visit. Feel free to contact us  anytime with any questions, problems, or concerns.  It was a pleasure to meet you today!  Websites that have reliable patient information: 1. American Academy of Asthma, Allergy, and Immunology: www.aaaai.org 2. Food Allergy Research and Education (FARE): foodallergy.org 3. Mothers of Asthmatics: http://www.asthmacommunitynetwork.org 4. American College of Allergy, Asthma, and Immunology: www.acaai.org      "Like" us  on Facebook and Instagram for our latest updates!      A healthy democracy works best when [employer] participate! Make sure you are registered to vote! If you have moved or changed any of your contact information, you will need to get this updated before voting! Scan the QR codes below to learn more!

## 2024-04-16 ENCOUNTER — Encounter: Payer: Self-pay | Admitting: Allergy & Immunology

## 2024-04-19 ENCOUNTER — Ambulatory Visit: Payer: Self-pay | Admitting: Allergy & Immunology

## 2024-04-19 DIAGNOSIS — D649 Anemia, unspecified: Secondary | ICD-10-CM

## 2024-04-21 LAB — CBC WITH DIFF/PLATELET
Basophils Absolute: 0 x10E3/uL (ref 0.0–0.2)
Basos: 1 %
EOS (ABSOLUTE): 0.2 x10E3/uL (ref 0.0–0.4)
Eos: 3 %
Hematocrit: 33.6 % — ABNORMAL LOW (ref 34.0–46.6)
Hemoglobin: 10.4 g/dL — ABNORMAL LOW (ref 11.1–15.9)
Immature Grans (Abs): 0 x10E3/uL (ref 0.0–0.1)
Immature Granulocytes: 0 %
Lymphocytes Absolute: 1.8 x10E3/uL (ref 0.7–3.1)
Lymphs: 27 %
MCH: 24.2 pg — ABNORMAL LOW (ref 26.6–33.0)
MCHC: 31 g/dL — ABNORMAL LOW (ref 31.5–35.7)
MCV: 78 fL — ABNORMAL LOW (ref 79–97)
Monocytes Absolute: 0.5 x10E3/uL (ref 0.1–0.9)
Monocytes: 8 %
Neutrophils Absolute: 4 x10E3/uL (ref 1.4–7.0)
Neutrophils: 61 %
Platelets: 200 x10E3/uL (ref 150–450)
RBC: 4.3 x10E6/uL (ref 3.77–5.28)
RDW: 15.7 % — ABNORMAL HIGH (ref 11.7–15.4)
WBC: 6.5 x10E3/uL (ref 3.4–10.8)

## 2024-04-21 LAB — ALPHA-GAL PANEL
Allergen Lamb IgE: <0.1 kU/L
Beef IgE: <0.1 kU/L
IgE (Immunoglobulin E), Serum: 187 [IU]/mL (ref 6–495)
O215-IgE Alpha-Gal: <0.1 kU/L
Pork IgE: <0.1 kU/L

## 2024-04-21 LAB — CMP14+EGFR
ALT: 11 IU/L (ref 0–32)
AST: 20 IU/L (ref 0–40)
Albumin: 4.5 g/dL (ref 3.9–4.9)
Alkaline Phosphatase: 88 IU/L (ref 41–116)
BUN/Creatinine Ratio: 23 (ref 9–23)
BUN: 16 mg/dL (ref 6–24)
Bilirubin Total: 0.4 mg/dL (ref 0.0–1.2)
CO2: 18 mmol/L — ABNORMAL LOW (ref 20–29)
Calcium: 9.8 mg/dL (ref 8.7–10.2)
Chloride: 102 mmol/L (ref 96–106)
Creatinine, Ser: 0.71 mg/dL (ref 0.57–1.00)
Globulin, Total: 3.1 g/dL (ref 1.5–4.5)
Glucose: 75 mg/dL (ref 70–99)
Potassium: 4.2 mmol/L (ref 3.5–5.2)
Sodium: 137 mmol/L (ref 134–144)
Total Protein: 7.6 g/dL (ref 6.0–8.5)
eGFR: 107 mL/min/1.73 (ref >59)

## 2024-04-21 LAB — CHRONIC URTICARIA PD-BAT: Pooled Donor- BAT CU: 8.3 % (ref 0.00–10.60)

## 2024-04-21 LAB — THYROID ANTIBODIES (THYROPEROXIDASE & THYROGLOBULIN)
Thyroglobulin Antibody: 1.1 [IU]/mL — ABNORMAL HIGH (ref 0.0–0.9)
Thyroperoxidase Ab SerPl-aCnc: 14 [IU]/mL (ref 0–34)

## 2024-04-21 LAB — TRYPTASE: Tryptase: 2.7 ug/L (ref 2.2–13.2)

## 2024-04-21 LAB — ANTINUCLEAR ANTIBODIES, IFA: ANA Titer 1: NEGATIVE

## 2024-04-21 LAB — C-REACTIVE PROTEIN: CRP: 1 mg/L (ref 0–10)

## 2024-04-21 LAB — SEDIMENTATION RATE: Sed Rate: 34 mm/h — ABNORMAL HIGH (ref 0–32)

## 2024-05-07 ENCOUNTER — Ambulatory Visit: Admitting: Allergy & Immunology

## 2024-05-30 ENCOUNTER — Ambulatory Visit: Admitting: Allergy & Immunology

## 2024-06-17 NOTE — Patient Instructions (Incomplete)
 1. Swelling of lip, tongue, and throat - with urticaria (hives) -Skin testing today was borderline positive to hazelnut.  Start avoiding hazelnut for now. -Emergency action plan given and reviewed -Prescription for EpiPen  sent in demonstrated how to use - Your history does not have any red flags such as fevers, joint pains, or permanent skin changes that would be concerning for a more serious cause of hives.  - We will get some labs to rule out serious causes of hives: alpha gal panel complete blood count, tryptase level, chronic urticaria panel, CMP, ESR, and CRP. - Chronic hives are often times a self limited process and will burn themselves out over 6-12 months, although this is not always the case.  - In the meantime, start suppressive dosing of antihistamines AT THE FIRST SIGN OF A HIVE/SWELLING OUTBREAK for 3-5 days:    - Morning: Zyrtec  (cetirizine ) 10mg  + Pepcid (famotidine) 20mg    - Evening: Zyrtec  (cetirizine ) 10mg  + Pepcid (famotidine) 20mg   - You can change this dosing at home, decreasing the dose as needed or increasing the dosing as needed.  - If you are not tolerating the medications or are tired of taking them every day, we can start treatment with a monthly injectable medication called Xolair.   2. Allergic rhinitis - Skin testing today is positive to grass pollen, weed pollen, and tree pollen with adequate controls. Intradermal skin testing is positive mold mix 1, mold mix 2, mold mix 3, mold mix 4, dust mite mix, cat hair, and dog epithelia - Copy of skin test given - Start avoidance measures as below - Stop Benadryl - May use Zyrtec  10 mg once a day as needed for runny nose/itching as above  - Start Flonase  (fluticasone ) 1-2 sprays in each nostril once a day as needed for stuffy nose. In the right nostril, point the applicator out toward the right ear. In the left nostril, point the applicator out toward the left ear - Consider allergy injections as a means of long-term  control. - Allergy injections re-train and reset the immune system to ignore environmental allergens and decrease the resulting immune response to those allergens (sneezing, itchy watery eyes, runny nose, nasal congestion, etc).    - Allergy injections improve symptoms in 75-85% of patients.   - We can discuss this more at the next appointment if the medications are not working for you. -CPT codes given to call insurance and find out the cost of allergy injections. Give our office a call if interested in starting allergy injections.  3. Follow up in 4 weeks or sooner if needed    Please inform us  of any Emergency Department visits, hospitalizations, or changes in symptoms. Call us  before going to the ED for breathing or allergy symptoms since we might be able to fit you in for a sick visit. Feel free to contact us  anytime with any questions, problems, or concerns.  It was a pleasure to meet you today!  Websites that have reliable patient information: 1. American Academy of Asthma, Allergy, and Immunology: www.aaaai.org 2. Food Allergy Research and Education (FARE): foodallergy.org 3. Mothers of Asthmatics: http://www.asthmacommunitynetwork.org 4. American College of Allergy, Asthma, and Immunology: www.acaai.org   Reducing Pollen Exposure The American Academy of Allergy, Asthma and Immunology suggests the following steps to reduce your exposure to pollen during allergy seasons. Do not hang sheets or clothing out to dry; pollen may collect on these items. Do not mow lawns or spend time around freshly cut grass; mowing stirs up  pollen. Keep windows closed at night.  Keep car windows closed while driving. Minimize morning activities outdoors, a time when pollen counts are usually at their highest. Stay indoors as much as possible when pollen counts or humidity is high and on windy days when pollen tends to remain in the air longer. Use air conditioning when possible.  Many air  conditioners have filters that trap the pollen spores. Use a HEPA room air filter to remove pollen form the indoor air you breathe.   Control of Dust Mite Allergen Dust mites play a major role in allergic asthma and rhinitis. They occur in environments with high humidity wherever human skin is found. Dust mites absorb humidity from the atmosphere (ie, they do not drink) and feed on organic matter (including shed human and animal skin). Dust mites are a microscopic type of insect that you cannot see with the naked eye. High levels of dust mites have been detected from mattresses, pillows, carpets, upholstered furniture, bed covers, clothes, soft toys and any woven material. The principal allergen of the dust mite is found in its feces. A gram of dust may contain 1,000 mites and 250,000 fecal particles. Mite antigen is easily measured in the air during house cleaning activities. Dust mites do not bite and do not cause harm to humans, other than by triggering allergies/asthma.  Ways to decrease your exposure to dust mites in your home:  1. Encase mattresses, box springs and pillows with a mite-impermeable barrier or cover  2. Wash sheets, blankets and drapes weekly in hot water (130 F) with detergent and dry them in a dryer on the hot setting.  3. Have the room cleaned frequently with a vacuum cleaner and a damp dust-mop. For carpeting or rugs, vacuuming with a vacuum cleaner equipped with a high-efficiency particulate air (HEPA) filter. The dust mite allergic individual should not be in a room which is being cleaned and should wait 1 hour after cleaning before going into the room.  4. Do not sleep on upholstered furniture (eg, couches).  5. If possible removing carpeting, upholstered furniture and drapery from the home is ideal. Horizontal blinds should be eliminated in the rooms where the person spends the most time (bedroom, study, television room). Washable vinyl, roller-type shades are  optimal.  6. Remove all non-washable stuffed toys from the bedroom. Wash stuffed toys weekly like sheets and blankets above.  7. Reduce indoor humidity to less than 50%. Inexpensive humidity monitors can be purchased at most hardware stores. Do not use a humidifier as can make the problem worse and are not recommended.  Control of Mold Allergen Mold and fungi can grow on a variety of surfaces provided certain temperature and moisture conditions exist.  Outdoor molds grow on plants, decaying vegetation and soil.  The major outdoor mold, Alternaria and Cladosporium, are found in very high numbers during hot and dry conditions.  Generally, a late Summer - Fall peak is seen for common outdoor fungal spores.  Rain will temporarily lower outdoor mold spore count, but counts rise rapidly when the rainy period ends.  The most important indoor molds are Aspergillus and Penicillium.  Dark, humid and poorly ventilated basements are ideal sites for mold growth.  The next most common sites of mold growth are the bathroom and the kitchen.  Outdoor [employer] Use air conditioning and keep windows closed Avoid exposure to decaying vegetation. Avoid leaf raking. Avoid grain handling. Consider wearing a face mask if working in moldy areas.  Indoor  Mold Control Maintain humidity below 50%. Clean washable surfaces with 5% bleach solution. Remove sources e.g. Contaminated carpets.  Control of Dog or Cat Allergen Avoidance is the best way to manage a dog or cat allergy. If you have a dog or cat and are allergic to dog or cats, consider removing the dog or cat from the home. If you have a dog or cat but dont want to find it a new home, or if your family wants a pet even though someone in the household is allergic, here are some strategies that may help keep symptoms at bay:  Keep the pet out of your bedroom and restrict it to only a few rooms. Be advised that keeping the dog or cat in only one room will not  limit the allergens to that room. Dont pet, hug or kiss the dog or cat; if you do, wash your hands with soap and water. High-efficiency particulate air (HEPA) cleaners run continuously in a bedroom or living room can reduce allergen levels over time. Regular use of a high-efficiency vacuum cleaner or a central vacuum can reduce allergen levels. Giving your dog or cat a bath at least once a week can reduce airborne allergen.

## 2024-06-18 ENCOUNTER — Ambulatory Visit: Admitting: Family

## 2024-06-18 ENCOUNTER — Encounter: Payer: Self-pay | Admitting: Family

## 2024-06-18 DIAGNOSIS — L508 Other urticaria: Secondary | ICD-10-CM

## 2024-06-18 DIAGNOSIS — R220 Localized swelling, mass and lump, head: Secondary | ICD-10-CM

## 2024-06-18 DIAGNOSIS — J302 Other seasonal allergic rhinitis: Secondary | ICD-10-CM | POA: Diagnosis not present

## 2024-06-18 DIAGNOSIS — J3089 Other allergic rhinitis: Secondary | ICD-10-CM | POA: Diagnosis not present

## 2024-06-18 DIAGNOSIS — R221 Localized swelling, mass and lump, neck: Secondary | ICD-10-CM

## 2024-06-18 MED ORDER — EPINEPHRINE 0.3 MG/0.3ML IJ SOAJ: 0.3000 mg | INTRAMUSCULAR | 1 refills | Status: AC | PRN

## 2024-06-18 MED ORDER — FLUTICASONE PROPIONATE 50 MCG/ACT NA SUSP: NASAL | 5 refills | Status: AC

## 2024-06-18 NOTE — Progress Notes (Signed)
 " Date of Service/Encounter:  06/18/2024  Allergy testing appointment   Initial visit on 09/01/2023, seen for swelling of lip/tongue and throat with hives and chronic rhinitis.  Please see that note for additional details.  She reports 1 episodes of her lip swelling since her last office visit.  She denies any concomitant cardiorespiratory or gastrointestinal symptoms.  She also denies any rashes or hives with this swelling.  She is not able to pinpoint what is causing it.  She does mention that she had previously had Jodie Edison and [employer] before the swelling had occurred, but she had [employer] a few weeks ago and she did not have any problems.  She cannot remember what she had to eat with this most recent swelling.  She will feel tingling first of her lip.  She wonders if her tooth that needs to be pulled could be causing this.  She does have an appointment to see her dentist.  She reports that when she has the swelling that it does go down with an antihistamine. She does eat foods containing hazelnut at times.  Today reports for allergy diagnostic testing:    DIAGNOSTICS:  Skin Testing: Environmental allergy panel and select foods. Adequate positive and negative controls Results discussed with patient/family.   Airborne Adult Perc - 06/18/24 0900     Time Antigen Placed 0935    Allergen Manufacturer Jestine    Location Back    Number of Test 55    Panel 1 Select    1. Control-Buffer 50% Glycerol Negative    3. Bahia Negative    4. Bermuda Negative    5. Johnson Negative    6. [place]  Blue Negative    7. Meadow Fescue Negative    8. Perennial Rye 2+    9. Timothy Negative    10. Ragweed Mix Negative    11. Cocklebur Negative    12. Plantain,  English Negative    13. Baccharis Negative    14. Dog Fennel 2+    15. Russian Thistle Negative    16. Lamb's Quarters Negative    17. Sheep Sorrell Negative    18. Rough Pigweed Negative    19. Marsh Elder, Rough Negative    20. Mugwort,  Common Negative    21. Box, Elder Negative    22. Cedar, red 3+    23. Sweet Gum Negative    24. Pecan Pollen 3+    25. Pine Mix Negative    26. Walnut, Black Pollen Negative    27. Red Mulberry Negative    28. Ash Mix Negative    29. Birch Mix Negative    30. Beech American Negative    31. Cottonwood, Eastern Negative    32. Hickory, White 3+    33. Maple Mix Negative    34. Oak, Eastern Mix Negative    35. Sycamore Eastern Negative    36. Alternaria Alternata Negative    37. Cladosporium Herbarum Negative    38. Aspergillus Mix Negative    39. Penicillium Mix Negative    40. Bipolaris Sorokiniana (Helminthosporium) Negative    41. Drechslera Spicifera (Curvularia) Negative    42. Mucor Plumbeus Negative    43. Fusarium Moniliforme Negative    44. Aureobasidium Pullulans (pullulara) Negative    45. Rhizopus Oryzae Negative    46. Botrytis Cinera Negative    47. Epicoccum Nigrum Negative    48. Phoma Betae Negative    49. Dust Mite Mix Negative    50.  Cat Hair 10,000 BAU/ml Negative    51.  Dog Epithelia Negative    52. Mixed Feathers Negative    53. Horse Epithelia Negative    54. Cockroach, German Negative    55. Tobacco Leaf Negative          13 Food Perc - 06/18/24 0900       Test Information   Time Antigen Placed 9064    Allergen Manufacturer Jestine    Location Back    Number of allergen test 13    Food Select      Food   1. Peanut Negative    2. Soybean Negative    3. Wheat Negative    4. Sesame Negative    5. Milk, Cow Negative    6. Casein Negative    7. Egg White, Chicken Negative    8. Shellfish Mix Negative    9. Fish Mix Negative    10. Cashew Negative    11. Walnut Food Negative    12. Almond Negative    13. Hazelnut --   +/-         Intradermal - 06/18/24 1000     Time Antigen Placed 1038    Allergen Manufacturer Greer    Location Back    Number of Test 14    Control Negative    Bahia Negative    Bermuda Negative    Johnson  Negative    Ragweed Mix Negative    Weed Mix Negative    Mold 1 2+    Mold 2 2+    Mold 3 3+    Mold 4 3+    Mite Mix 4+    Cat 3+    Dog 3+    Cockroach Negative             Allergy testing results were read and interpreted by myself, documented by clinical staff.  Patient provided with copy of allergy testing along with avoidance measures when indicated.   1. Swelling of lip, tongue, and throat - with urticaria (hives) -Skin testing today was borderline positive to hazelnut.  Start avoiding hazelnut for now. -Emergency action plan given and reviewed -Prescription for EpiPen  sent in demonstrated how to use - Your history does not have any red flags such as fevers, joint pains, or permanent skin changes that would be concerning for a more serious cause of hives.  - We will get some labs to rule out serious causes of hives: alpha gal panel complete blood count, tryptase level, chronic urticaria panel, CMP, ESR, and CRP. - Chronic hives are often times a self limited process and will burn themselves out over 6-12 months, although this is not always the case.  - In the meantime, start suppressive dosing of antihistamines AT THE FIRST SIGN OF A HIVE/SWELLING OUTBREAK for 3-5 days:    - Morning: Zyrtec  (cetirizine ) 10mg  + Pepcid (famotidine) 20mg    - Evening: Zyrtec  (cetirizine ) 10mg  + Pepcid (famotidine) 20mg   - You can change this dosing at home, decreasing the dose as needed or increasing the dosing as needed.  - If you are not tolerating the medications or are tired of taking them every day, we can start treatment with a monthly injectable medication called Xolair.   2. Allergic rhinitis - Skin testing today is positive to grass pollen, weed pollen, and tree pollen with adequate controls. Intradermal skin testing is positive mold mix 1, mold mix 2, mold mix 3, mold mix 4, dust mite mix,  cat hair, and dog epithelia - Copy of skin test given - Start avoidance measures as  below - Stop Benadryl - May use Zyrtec  10 mg once a day as needed for runny nose/itching as above  - Start Flonase  (fluticasone ) 1-2 sprays in each nostril once a day as needed for stuffy nose. In the right nostril, point the applicator out toward the right ear. In the left nostril, point the applicator out toward the left ear - Consider allergy injections as a means of long-term control. - Allergy injections re-train and reset the immune system to ignore environmental allergens and decrease the resulting immune response to those allergens (sneezing, itchy watery eyes, runny nose, nasal congestion, etc).    - Allergy injections improve symptoms in 75-85% of patients.   - We can discuss this more at the next appointment if the medications are not working for you. -CPT codes given to call insurance and find out the cost of allergy injections. Give our office a call if interested in starting allergy injections.  3. Follow up in 4 weeks or sooner if needed    Please inform us  of any Emergency Department visits, hospitalizations, or changes in symptoms. Call us  before going to the ED for breathing or allergy symptoms since we might be able to fit you in for a sick visit. Feel free to contact us  anytime with any questions, problems, or concerns.  It was a pleasure to meet you today!  Websites that have reliable patient information: 1. American Academy of Asthma, Allergy, and Immunology: www.aaaai.org 2. Food Allergy Research and Education (FARE): foodallergy.org 3. Mothers of Asthmatics: http://www.asthmacommunitynetwork.org 4. American College of Allergy, Asthma, and Immunology: www.acaai.org   Reducing Pollen Exposure The American Academy of Allergy, Asthma and Immunology suggests the following steps to reduce your exposure to pollen during allergy seasons. Do not hang sheets or clothing out to dry; pollen may collect on these items. Do not mow lawns or spend time around freshly cut grass;  mowing stirs up pollen. Keep windows closed at night.  Keep car windows closed while driving. Minimize morning activities outdoors, a time when pollen counts are usually at their highest. Stay indoors as much as possible when pollen counts or humidity is high and on windy days when pollen tends to remain in the air longer. Use air conditioning when possible.  Many air conditioners have filters that trap the pollen spores. Use a HEPA room air filter to remove pollen form the indoor air you breathe.   Control of Dust Mite Allergen Dust mites play a major role in allergic asthma and rhinitis. They occur in environments with high humidity wherever human skin is found. Dust mites absorb humidity from the atmosphere (ie, they do not drink) and feed on organic matter (including shed human and animal skin). Dust mites are a microscopic type of insect that you cannot see with the naked eye. High levels of dust mites have been detected from mattresses, pillows, carpets, upholstered furniture, bed covers, clothes, soft toys and any woven material. The principal allergen of the dust mite is found in its feces. A gram of dust may contain 1,000 mites and 250,000 fecal particles. Mite antigen is easily measured in the air during house cleaning activities. Dust mites do not bite and do not cause harm to humans, other than by triggering allergies/asthma.  Ways to decrease your exposure to dust mites in your home:  1. Encase mattresses, box springs and pillows with a mite-impermeable barrier or cover  2. Wash sheets, blankets and drapes weekly in hot water (130 F) with detergent and dry them in a dryer on the hot setting.  3. Have the room cleaned frequently with a vacuum cleaner and a damp dust-mop. For carpeting or rugs, vacuuming with a vacuum cleaner equipped with a high-efficiency particulate air (HEPA) filter. The dust mite allergic individual should not be in a room which is being cleaned and should wait 1  hour after cleaning before going into the room.  4. Do not sleep on upholstered furniture (eg, couches).  5. If possible removing carpeting, upholstered furniture and drapery from the home is ideal. Horizontal blinds should be eliminated in the rooms where the person spends the most time (bedroom, study, television room). Washable vinyl, roller-type shades are optimal.  6. Remove all non-washable stuffed toys from the bedroom. Wash stuffed toys weekly like sheets and blankets above.  7. Reduce indoor humidity to less than 50%. Inexpensive humidity monitors can be purchased at most hardware stores. Do not use a humidifier as can make the problem worse and are not recommended.  Control of Mold Allergen Mold and fungi can grow on a variety of surfaces provided certain temperature and moisture conditions exist.  Outdoor molds grow on plants, decaying vegetation and soil.  The major outdoor mold, Alternaria and Cladosporium, are found in very high numbers during hot and dry conditions.  Generally, a late Summer - Fall peak is seen for common outdoor fungal spores.  Rain will temporarily lower outdoor mold spore count, but counts rise rapidly when the rainy period ends.  The most important indoor molds are Aspergillus and Penicillium.  Dark, humid and poorly ventilated basements are ideal sites for mold growth.  The next most common sites of mold growth are the bathroom and the kitchen.  Outdoor [employer] Use air conditioning and keep windows closed Avoid exposure to decaying vegetation. Avoid leaf raking. Avoid grain handling. Consider wearing a face mask if working in moldy areas.  Indoor Mold Control Maintain humidity below 50%. Clean washable surfaces with 5% bleach solution. Remove sources e.g. Contaminated carpets.  Control of Dog or Cat Allergen Avoidance is the best way to manage a dog or cat allergy. If you have a dog or cat and are allergic to dog or cats, consider removing the dog  or cat from the home. If you have a dog or cat but dont want to find it a new home, or if your family wants a pet even though someone in the household is allergic, here are some strategies that may help keep symptoms at bay:  Keep the pet out of your bedroom and restrict it to only a few rooms. Be advised that keeping the dog or cat in only one room will not limit the allergens to that room. Dont pet, hug or kiss the dog or cat; if you do, wash your hands with soap and water. High-efficiency particulate air (HEPA) cleaners run continuously in a bedroom or living room can reduce allergen levels over time. Regular use of a high-efficiency vacuum cleaner or a central vacuum can reduce allergen levels. Giving your dog or cat a bath at least once a week can reduce airborne allergen.    Wanda Craze, FNP [facility]   "

## 2024-07-15 NOTE — Patient Instructions (Incomplete)
 1. Swelling of lip, tongue, and throat - with urticaria (hives)-no episodes since last office visit -Skin testing on 06/18/24 was borderline positive to hazelnut.  Continue avoiding hazelnut for now. - Follow Emergency action plan  -Have access to your EpiPen  at all times. The EpiPen  at [facility] should be $55.00 after calling the pharmacy. Let us  know if it is not - Your history does not have any red flags such as fevers, joint pains, or permanent skin changes that would be concerning for a more serious cause of hives.  - Chronic hives are often times a self limited process and will burn themselves out over 6-12 months, although this is not always the case.  - In the meantime, start suppressive dosing of antihistamines AT THE FIRST SIGN OF A HIVE/SWELLING OUTBREAK for 3-5 days:    - Morning: Zyrtec  (cetirizine ) 10mg  + Pepcid (famotidine) 20mg    - Evening: Zyrtec  (cetirizine ) 10mg  + Pepcid (famotidine) 20mg   - You can change this dosing at home, decreasing the dose as needed or increasing the dosing as needed.  - If you are not tolerating the medications or are tired of taking them every day, we can start treatment with a monthly injectable medication called Xolair.   2. Allergic rhinitis - Skin testing today  on 06/18/24 was positive to grass pollen, weed pollen, and tree pollen with adequate controls. Intradermal skin testing was positive mold mix 1, mold mix 2, mold mix 3, mold mix 4, dust mite mix, cat hair, and dog epithelia - Continue avoidance measures as below - May use Zyrtec  10 mg once a day as needed for runny nose/itching as above  - Start Flonase  (fluticasone ) 1-2 sprays in each nostril once a day as needed for stuffy nose. In the right nostril, point the applicator out toward the right ear. In the left nostril, point the applicator out toward the left ear - Consider allergy  injections as a means of long-term control. - Allergy  injections re-train and reset the immune system to  ignore environmental allergens and decrease the resulting immune response to those allergens (sneezing, itchy watery eyes, runny nose, nasal congestion, etc).    - Allergy  injections improve symptoms in 75-85% of patients.   - We can discuss this more at the next appointment if the medications are not working for you. -CPT codes given at last office visit to call insurance and find out the cost of allergy  injections. Give our office a call if interested in starting allergy  injections.  3. 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 Call us  before going to the ED for breathing or allergy  symptoms since we might be able to fit you in for a sick visit. Feel free to contact us  anytime with any questions, problems, or concerns.  It was a pleasure to see you today!  Websites that have reliable patient information: 1. American Academy of Asthma, Allergy , and Immunology: www.aaaai.org 2. Food Allergy  Research and Education (FARE): foodallergy.org 3. Mothers of Asthmatics: http://www.asthmacommunitynetwork.org 4. American College of Allergy , Asthma, and Immunology: www.acaai.org   Reducing Pollen Exposure The American Academy of Allergy , Asthma and Immunology suggests the following steps to reduce your exposure to pollen during allergy  seasons. Do not hang sheets or clothing out to dry; pollen may collect on  these items. Do not mow lawns or spend time around freshly cut grass; mowing stirs up pollen. Keep windows closed at night.  Keep car windows closed while driving. Minimize morning activities outdoors, a time when pollen counts are usually at their highest. Stay indoors as much as possible when pollen counts or humidity is high and on windy days when pollen  tends to remain in the air longer. Use air conditioning when possible.  Many air conditioners have filters that trap the pollen spores. Use a HEPA room air filter to remove pollen form the indoor air you breathe.   Control of Dust Mite Allergen Dust mites play a major role in allergic asthma and rhinitis. They occur in environments with high humidity wherever human skin is found. Dust mites absorb humidity from the atmosphere (ie, they do not drink) and feed on organic matter (including shed human and animal skin). Dust mites are a microscopic type of insect that you cannot see with the naked eye. High levels of dust mites have been detected from mattresses, pillows, carpets, upholstered furniture, bed covers, clothes, soft toys and any woven material. The principal allergen of the dust mite is found in its feces. A gram of dust may contain 1,000 mites and 250,000 fecal particles. Mite antigen is easily measured in the air during house cleaning activities. Dust mites do not bite and do not cause harm to humans, other than by triggering allergies/asthma.  Ways to decrease your exposure to dust mites in your home:  1. Encase mattresses, box springs and pillows with a mite-impermeable barrier or cover  2. Wash sheets, blankets and drapes weekly in hot water (130 F) with detergent and dry them in a dryer on the hot setting.  3. Have the room cleaned frequently with a vacuum cleaner and a damp dust-mop. For carpeting or rugs, vacuuming with a vacuum cleaner equipped with a high-efficiency particulate air (HEPA) filter. The dust mite allergic individual should not be in a room which is being cleaned and should wait 1 hour after cleaning before going into the room.  4. Do not sleep on upholstered furniture (eg, couches).  5. If possible removing carpeting, upholstered furniture and drapery from the home is ideal. Horizontal blinds should be eliminated in the rooms where the person spends the most time  (bedroom, study, television room). Washable vinyl, roller-type shades are optimal.  6. Remove all non-washable stuffed toys from the bedroom. Wash stuffed toys weekly like sheets and blankets above.  7. Reduce indoor humidity to less than 50%. Inexpensive humidity monitors can be purchased at most hardware stores. Do not use a humidifier as can make the problem worse and are not recommended.  Control of Mold Allergen Mold and fungi can grow on a variety of surfaces provided certain temperature and moisture conditions exist.  Outdoor molds grow on plants, decaying vegetation and soil.  The major outdoor mold, Alternaria and Cladosporium, are found in very high numbers during hot and dry conditions.  Generally, a late Summer - Fall peak is seen for common outdoor fungal spores.  Rain will temporarily lower outdoor mold spore count, but counts rise rapidly when the rainy period ends.  The most important indoor molds are Aspergillus and Penicillium.  Dark, humid and poorly ventilated basements are ideal sites for mold growth.  The next most common sites of mold growth are the bathroom and the kitchen.  Outdoor [employer] Use air conditioning and keep windows closed Avoid exposure to decaying vegetation. Avoid leaf  raking. Avoid grain handling. Consider wearing a face mask if working in moldy areas.  Indoor Mold Control Maintain humidity below 50%. Clean washable surfaces with 5% bleach solution. Remove sources e.g. Contaminated carpets.  Control of Dog or Cat Allergen Avoidance is the best way to manage a dog or cat allergy . If you have a dog or cat and are allergic to dog or cats, consider removing the dog or cat from the home. If you have a dog or cat but dont want to find it a new home, or if your family wants a pet even though someone in the household is allergic, here are some strategies that may help keep symptoms at bay:  Keep the pet out of your bedroom and restrict it to only a few  rooms. Be advised that keeping the dog or cat in only one room will not limit the allergens to that room. Dont pet, hug or kiss the dog or cat; if you do, wash your hands with soap and water. High-efficiency particulate air (HEPA) cleaners run continuously in a bedroom or living room can reduce allergen levels over time. Regular use of a high-efficiency vacuum cleaner or a central vacuum can reduce allergen levels. Giving your dog or cat a bath at least once a week can reduce airborne allergen.

## 2024-07-16 ENCOUNTER — Encounter: Payer: Self-pay | Admitting: Family

## 2024-07-16 ENCOUNTER — Ambulatory Visit: Admitting: Family

## 2024-07-16 ENCOUNTER — Other Ambulatory Visit: Payer: Self-pay

## 2024-07-16 VITALS — BP 130/80 | HR 87 | Temp 97.8°F

## 2024-07-16 DIAGNOSIS — L853 Xerosis cutis: Secondary | ICD-10-CM

## 2024-07-16 DIAGNOSIS — J3089 Other allergic rhinitis: Secondary | ICD-10-CM

## 2024-07-16 DIAGNOSIS — L508 Other urticaria: Secondary | ICD-10-CM

## 2024-07-16 DIAGNOSIS — R221 Localized swelling, mass and lump, neck: Secondary | ICD-10-CM | POA: Diagnosis not present

## 2024-07-16 DIAGNOSIS — R220 Localized swelling, mass and lump, head: Secondary | ICD-10-CM | POA: Diagnosis not present

## 2024-07-16 DIAGNOSIS — J302 Other seasonal allergic rhinitis: Secondary | ICD-10-CM

## 2024-07-16 NOTE — Progress Notes (Signed)
 "  [address] [place] [address] Dept: [phone]  FOLLOW UP NOTE  Patient ID: Karen Pollard, female    DOB: January 23, 1976  Age: 49 y.o. MRN: [id number] Date of Office Visit: 07/16/2024  Assessment  Chief Complaint: Follow-up (Allergies no concerns)  HPI Karen Pollard is a 49 year old female who presents today for follow-up of swelling of lip, tongue, and throat with urticaria and allergic rhinitis.  She was last seen on June 18, 2024 by myself.  She denies any new diagnosis or surgery since her last office visit.  Swelling of lips, tongue, and throat with urticaria: She reports that she has not had any swelling or hives since her last office visit.  She was not able to pick up the epinephrine  autoinjector device due to the cost of $245.  She has been avoiding hazelnut without any accidental ingestion or use of her epinephrine  autoinjector device.  She is just taking cetirizine  10 mg as needed and Pepcid 20 mg as needed.  Allergic rhinitis: She feels like her allergies are doing pretty good.  She continues to take Zyrtec  10 mg as needed and Flonase  nasal spray as needed.  She reports rhinorrhea that is clear in color and denies nasal congestion and postnasal drip.  She has not been treated for any sinus infections since we last saw her.  She reports dry hands.  She works in proofreader such as a teacher, adult education with washing dishes.  This has been ongoing for a couple of months.  She uses cocoa butter for moisturization.  She does not have any history of eczema.  The areas on her hands are itchy and painful.   Drug Allergies:  Allergies[1]  Review of Systems: Negative except as per HPI   Physical Exam: BP 130/80   Pulse 87   Temp 97.8 F (36.6 C)   SpO2 97%    Physical Exam Constitutional:      Appearance: Normal appearance.  HENT:     Head: Normocephalic and atraumatic.     Comments: Pharynx normal, eyes normal, ears normal, nose normal    Right Ear:  Tympanic membrane, ear canal and external ear normal.     Left Ear: Tympanic membrane, ear canal and external ear normal.     Nose: Nose normal.     Mouth/Throat:     Mouth: Mucous membranes are moist.     Pharynx: Oropharynx is clear.  Eyes:     Conjunctiva/sclera: Conjunctivae normal.  Cardiovascular:     Rate and Rhythm: Regular rhythm.     Heart sounds: Normal heart sounds.  Pulmonary:     Effort: Pulmonary effort is normal.     Breath sounds: Normal breath sounds.     Comments: Lungs clear to auscultation Musculoskeletal:     Cervical back: Neck supple.  Skin:    General: Skin is warm and dry.     Comments: Dry skin noted on bilateral hands.  Crack noted on left pointer finger.  No oozing, bleeding, or erythema noted  Neurological:     Mental Status: She is alert and oriented to person, place, and time.  Psychiatric:        Mood and Affect: Mood normal.        Behavior: Behavior normal.        Thought Content: Thought content normal.        Judgment: Judgment normal.     Diagnostics:  None  Assessment and Plan: 1. Swelling of lip, tongue,  and throat   2. Chronic urticaria   3. Seasonal and perennial allergic rhinitis   4. Severely dry skin     No orders of the defined types were placed in this encounter.   Patient Instructions  1. Swelling of lip, tongue, and throat - with urticaria (hives)-no episodes since last office visit -Skin testing on 06/18/24 was borderline positive to hazelnut.  Continue avoiding hazelnut for now. - Follow Emergency action plan  -Have access to your EpiPen  at all times. [facility] should be $55.00 after calling the pharmacy. Let us  know if it is not - Your history does not have any red flags such as fevers, joint pains, or permanent skin changes that would be concerning for a more serious cause of hives.  - Chronic hives are often times a self limited process and will burn themselves out over 6-12 months, although this is not  always the case.  - In the meantime, start suppressive dosing of antihistamines AT THE FIRST SIGN OF A HIVE/SWELLING OUTBREAK for 3-5 days:    - Morning: Zyrtec  (cetirizine ) 10mg  + Pepcid (famotidine) 20mg    - Evening: Zyrtec  (cetirizine ) 10mg  + Pepcid (famotidine) 20mg   - You can change this dosing at home, decreasing the dose as needed or increasing the dosing as needed.  - If you are not tolerating the medications or are tired of taking them every day, we can start treatment with a monthly injectable medication called Xolair.   2. Allergic rhinitis - Skin testing today  on 06/18/24 was positive to grass pollen, weed pollen, and tree pollen with adequate controls. Intradermal skin testing was positive mold mix 1, mold mix 2, mold mix 3, mold mix 4, dust mite mix, cat hair, and dog epithelia - Continue avoidance measures as below - May use Zyrtec  10 mg once a day as needed for runny nose/itching as above  - Start Flonase  (fluticasone ) 1-2 sprays in each nostril once a day as needed for stuffy nose. In the right nostril, point the applicator out toward the right ear. In the left nostril, point the applicator out toward the left ear - Consider allergy  injections as a means of long-term control. - Allergy  injections re-train and reset the immune system to ignore environmental allergens and decrease the resulting immune response to those allergens (sneezing, itchy watery eyes, runny nose, nasal congestion, etc).    - Allergy  injections improve symptoms in 75-85% of patients.   - We can discuss this more at the next appointment if the medications are not working for you. -CPT codes given at last office visit to call insurance and find out the cost of allergy  injections. Give our office a call if interested in starting allergy  injections.  3. 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 Call us  before going to the ED for breathing or allergy  symptoms since we might be able to fit you in for a sick visit. Feel free to contact us  anytime with any questions, problems, or concerns.  It was a pleasure to see you today!  Websites that have reliable patient information: 1. American Academy of Asthma, Allergy , and Immunology: www.aaaai.org 2. Food Allergy  Research and Education (FARE): foodallergy.org 3. Mothers of Asthmatics: http://www.asthmacommunitynetwork.org 4. American College of Allergy , Asthma,  and Immunology: www.acaai.org   Reducing Pollen Exposure The American Academy of Allergy , Asthma and Immunology suggests the following steps to reduce your exposure to pollen during allergy  seasons. Do not hang sheets or clothing out to dry; pollen may collect on these items. Do not mow lawns or spend time around freshly cut grass; mowing stirs up pollen. Keep windows closed at night.  Keep car windows closed while driving. Minimize morning activities outdoors, a time when pollen counts are usually at their highest. Stay indoors as much as possible when pollen counts or humidity is high and on windy days when pollen tends to remain in the air longer. Use air conditioning when possible.  Many air conditioners have filters that trap the pollen spores. Use a HEPA room air filter to remove pollen form the indoor air you breathe.   Control of Dust Mite Allergen Dust mites play a major role in allergic asthma and rhinitis. They occur in environments with high humidity wherever human skin is found. Dust mites absorb humidity from the atmosphere (ie, they do not drink) and feed on organic matter (including shed human and animal skin). Dust mites are a microscopic type of insect that you cannot see with the naked eye. High levels of dust mites have been detected  from mattresses, pillows, carpets, upholstered furniture, bed covers, clothes, soft toys and any woven material. The principal allergen of the dust mite is found in its feces. A gram of dust may contain 1,000 mites and 250,000 fecal particles. Mite antigen is easily measured in the air during house cleaning activities. Dust mites do not bite and do not cause harm to humans, other than by triggering allergies/asthma.  Ways to decrease your exposure to dust mites in your home:  1. Encase mattresses, box springs and pillows with a mite-impermeable barrier or cover  2. Wash sheets, blankets and drapes weekly in hot water (130 F) with detergent and dry them in a dryer on the hot setting.  3. Have the room cleaned frequently with a vacuum cleaner and a damp dust-mop. For carpeting or rugs, vacuuming with a vacuum cleaner equipped with a high-efficiency particulate air (HEPA) filter. The dust mite allergic individual should not be in a room which is being cleaned and should wait 1 hour after cleaning before going into the room.  4. Do not sleep on upholstered furniture (eg, couches).  5. If possible removing carpeting, upholstered furniture and drapery from the home is ideal. Horizontal blinds should be eliminated in the rooms where the person spends the most time (bedroom, study, television room). Washable vinyl, roller-type shades are optimal.  6. Remove all non-washable stuffed toys from the bedroom. Wash stuffed toys weekly like sheets and blankets above.  7. Reduce indoor humidity to less than 50%. Inexpensive humidity monitors can be purchased at most hardware stores. Do not use a humidifier as can make the problem worse and are not recommended.  Control of Mold Allergen Mold and fungi can grow on a variety of surfaces provided certain temperature and moisture conditions exist.  Outdoor molds grow on plants, decaying vegetation and soil.  The major outdoor mold, Alternaria and Cladosporium, are  found in very high numbers during hot and dry conditions.  Generally, a late Summer - Fall peak is seen for common outdoor fungal spores.  Rain will temporarily lower outdoor mold spore count, but counts rise rapidly when the rainy period ends.  The most important indoor molds are Aspergillus and Penicillium.  Dark, humid and  poorly ventilated basements are ideal sites for mold growth.  The next most common sites of mold growth are the bathroom and the kitchen.  Outdoor [employer] Use air conditioning and keep windows closed Avoid exposure to decaying vegetation. Avoid leaf raking. Avoid grain handling. Consider wearing a face mask if working in moldy areas.  Indoor Mold Control Maintain humidity below 50%. Clean washable surfaces with 5% bleach solution. Remove sources e.g. Contaminated carpets.  Control of Dog or Cat Allergen Avoidance is the best way to manage a dog or cat allergy . If you have a dog or cat and are allergic to dog or cats, consider removing the dog or cat from the home. If you have a dog or cat but dont want to find it a new home, or if your family wants a pet even though someone in the household is allergic, here are some strategies that may help keep symptoms at bay:  Keep the pet out of your bedroom and restrict it to only a few rooms. Be advised that keeping the dog or cat in only one room will not limit the allergens to that room. Dont pet, hug or kiss the dog or cat; if you do, wash your hands with soap and water. High-efficiency particulate air (HEPA) cleaners run continuously in a bedroom or living room can reduce allergen levels over time. Regular use of a high-efficiency vacuum cleaner or a central vacuum can reduce allergen levels. Giving your dog or cat a bath at least once a week can reduce airborne allergen.   Return in about 6 months (around 01/13/2025), or if symptoms worsen or fail to improve.    Thank you for the opportunity to care for this  patient.  Please do not hesitate to contact me with questions.  Wanda Craze, FNP [facility]         [1] No Known Allergies  "

## 2025-01-14 ENCOUNTER — Ambulatory Visit: Admitting: Allergy & Immunology
